# Patient Record
Sex: Male | Born: 1990 | Race: White | Hispanic: No | Marital: Single | Smoking: Never smoker
Health system: Southern US, Community
[De-identification: ages and names within clinical notes are randomized; demographics above are authoritative.]

## PROBLEM LIST (undated history)

## (undated) DIAGNOSIS — B019 Varicella without complication: Secondary | ICD-10-CM

## (undated) DIAGNOSIS — J302 Other seasonal allergic rhinitis: Secondary | ICD-10-CM

## (undated) DIAGNOSIS — J45909 Unspecified asthma, uncomplicated: Secondary | ICD-10-CM

## (undated) DIAGNOSIS — K219 Gastro-esophageal reflux disease without esophagitis: Secondary | ICD-10-CM

## (undated) DIAGNOSIS — K2970 Gastritis, unspecified, without bleeding: Secondary | ICD-10-CM

## (undated) HISTORY — DX: Other seasonal allergic rhinitis: J30.2

## (undated) HISTORY — DX: Gastro-esophageal reflux disease without esophagitis: K21.9

## (undated) HISTORY — DX: Unspecified asthma, uncomplicated: J45.909

## (undated) HISTORY — DX: Varicella without complication: B01.9

---
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
## 2015-09-15 ENCOUNTER — Telehealth: Payer: Self-pay | Admitting: Behavioral Health

## 2015-09-15 NOTE — Telephone Encounter (Signed)
Unable to reach patient at time of Pre-Visit Call.  Left message for patient to return call when available.    

## 2015-09-16 ENCOUNTER — Ambulatory Visit (INDEPENDENT_AMBULATORY_CARE_PROVIDER_SITE_OTHER): Payer: BLUE CROSS/BLUE SHIELD | Admitting: Family Medicine

## 2015-09-16 ENCOUNTER — Encounter: Payer: Self-pay | Admitting: Family Medicine

## 2015-09-16 VITALS — BP 116/59 | HR 63 | Temp 98.1°F | Ht 71.25 in | Wt 200.0 lb

## 2015-09-16 DIAGNOSIS — A749 Chlamydial infection, unspecified: Secondary | ICD-10-CM

## 2015-09-16 DIAGNOSIS — Z113 Encounter for screening for infections with a predominantly sexual mode of transmission: Secondary | ICD-10-CM

## 2015-09-16 DIAGNOSIS — R110 Nausea: Secondary | ICD-10-CM | POA: Diagnosis not present

## 2015-09-16 DIAGNOSIS — M25539 Pain in unspecified wrist: Secondary | ICD-10-CM | POA: Diagnosis not present

## 2015-09-16 MED ORDER — ONDANSETRON HCL 4 MG PO TABS: 4.0000 mg | ORAL_TABLET | Freq: Three times a day (TID) | ORAL | Status: DC | PRN

## 2015-09-16 NOTE — Progress Notes (Addendum)
[facility] at [facility] [address] [place], [place] [address] [phone]  Date:  09/16/2015   Name:  Dillon Martin   DOB:  03-Oct-1990   MRN:  [id number]  PCP:  Abbe Amsterdam, MD    Chief Complaint: Establish Care   History of Present Illness:  Dillon Martin is a 25 y.o. very pleasant male patient who presents with the following:  Here today as a new patient with several concerns. He would like STI testing.  A recent ex-GF had some sort of infection but he is not sure what exactly.   He has not noted any symptoms of same but wants to be sure all is ok  He has noted wrist pain for about 6 months.  It is more in his right hand, but some in the left.  He does use his hands a lot in his work and in his hobby as a Technical sales engineer.    He may have some tinging in his pinky fingers.   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  Not sure if he might have some OA in his hands as well His sx are worse at the end of the day He does tend to be on his computer a fair amount playing games  He has noted some pain in his right flank and occasional nausea He will very occasionally throw up.  He has not noted any foods that upset his stomach in particular Appetite is ok  He is generally in good health. Admits that his diet is not always the best but he tries to do well Never a smoker   There are no active problems to display for this patient.   No past medical history on file.  No past surgical history on file.  Social History  Substance Use Topics  . Smoking status: Never Smoker   . Smokeless tobacco: Not on file  . Alcohol Use: Not on file    No family history on file.  Allergies not on file  Medication list has been reviewed and updated.  No current outpatient prescriptions on file prior to visit.   No current facility-administered medications on file prior to visit.    Review of Systems:  As  per HPI- otherwise negative.   Physical Examination: Filed Vitals:   09/16/15 1429  BP: 116/59  Pulse: 63  Temp: 98.1 F (36.7 C)   Filed Vitals:   09/16/15 1429  Height: 5' 11.25" (1.81 m)  Weight: 200 lb (90.719 kg)   Body mass index is 27.69 kg/(m^2). Ideal Body Weight: Weight in (lb) to have BMI = 25: 180.1  GEN: WDWN, NAD, Non-toxic, A & O x 3, looks well HEENT: Atraumatic, Normocephalic. Neck supple. No masses, No LAD.  Bilateral TM wnl, oropharynx normal.  PEERL,EOMI.   Ears and Nose: No external deformity. CV: RRR, No M/G/R. No JVD. No thrill. No extra heart sounds. PULM: CTA B, no wheezes, crackles, rhonchi. No retractions. No resp. distress. No accessory muscle use. ABD: S, NT, ND, +BS. No rebound. No HSM.  Benign belly EXTR: No c/c/e NEURO Normal gait.  PSYCH: Normally interactive. Conversant. Not depressed or anxious appearing.  Calm demeanor.  Bilateral hands, wrists and elbows WNL.  No evidence of CTS, no joint stiffness or nodules, no heat, swelling or redness   Assessment and Plan: Screening for STD (sexually transmitted disease) - Plan: Hepatitis B surface antibody, Hepatitis  B surface antigen, Hepatitis C antibody, HIV antibody, GC/Chlamydia Probe Amp, RPR, Trichomonas vaginalis, RNA  Pain in wrist, unspecified laterality  Nausea without vomiting - Plan: Comprehensive metabolic panel, CBC, ondansetron (ZOFRAN) 4 MG tablet  [facility] STI screening as above Suspect overuse of his wrist; suggested an OTC cock-up splint  We will do STI screening for you today and I will be in touch with your results asap You can use the zofran as needed for any nausea or vomiting.  I will do basic labs today to look for any cause of stomach upset.  However, you also might try taking a 2 week course of OTC prilosec or pepcid to see if this is helpful  Try a wrist splint for your right wrist while you are at work.  Also, try to rest your hands when you are  not at work- less computer time  Let me know if your nausea/ stomach symptoms or wrist symptoms do not improve  Signed Abbe AmsterdamJessica Copland, MD  Called pt to let him know about positive chlamydia.  Will treat with azithromycin. Advised that any partner(s) must also be treated and no sex for 10 days after treatment.  He agrees with plan  Meds ordered this encounter  Medications  . ondansetron (ZOFRAN) 4 MG tablet    Sig: Take 1 tablet (4 mg total) by mouth every 8 (eight) hours as needed for nausea or vomiting.    Dispense:  20 tablet    Refill:  0  . azithromycin (ZITHROMAX) 250 MG tablet    Sig: Take 4 pills (1 gm) once    Dispense:  4 tablet    Refill:  0

## 2015-09-16 NOTE — Progress Notes (Signed)
Pre visit review using our clinic review tool, if applicable. No additional management support is needed unless otherwise documented below in the visit note. 

## 2015-09-16 NOTE — Patient Instructions (Signed)
We will do STI screening for you today and I will be in touch with your results asap You can use the zofran as needed for any nausea or vomiting.  I will do basic labs today to look for any cause of stomach upset.  However, you also might try taking a 2 week course of OTC prilosec or pepcid to see if this is helpful  Try a wrist splint for your right wrist while you are at work.  Also, try to rest your hands when you are not at work- less computer time  Let me know if your nausea/ stomach symptoms or wrist symptoms do not improve

## 2015-09-17 LAB — CBC
HCT: 42.4 % (ref 39.0–52.0)
Hemoglobin: 14.5 g/dL (ref 13.0–17.0)
MCHC: 34.3 g/dL (ref 30.0–36.0)
MCV: 90.1 fl (ref 78.0–100.0)
PLATELETS: 151 10*3/uL (ref 150.0–400.0)
RBC: 4.7 Mil/uL (ref 4.22–5.81)
RDW: 13.4 % (ref 11.5–15.5)
WBC: 5.4 10*3/uL (ref 4.0–10.5)

## 2015-09-17 LAB — COMPREHENSIVE METABOLIC PANEL
ALT: 13 U/L (ref 0–53)
AST: 19 U/L (ref 0–37)
Albumin: 4.3 g/dL (ref 3.5–5.2)
Alkaline Phosphatase: 30 U/L — ABNORMAL LOW (ref 39–117)
BILIRUBIN TOTAL: 0.6 mg/dL (ref 0.2–1.2)
BUN: 11 mg/dL (ref 6–23)
CALCIUM: 9 mg/dL (ref 8.4–10.5)
CO2: 32 meq/L (ref 19–32)
Chloride: 104 mEq/L (ref 96–112)
Creatinine, Ser: 0.92 mg/dL (ref 0.40–1.50)
GFR: 108.44 mL/min (ref >60.00)
Glucose, Bld: 87 mg/dL (ref 70–99)
Potassium: 3.8 mEq/L (ref 3.5–5.1)
Sodium: 140 mEq/L (ref 135–145)
Total Protein: 6.6 g/dL (ref 6.0–8.3)

## 2015-09-17 LAB — HEPATITIS C ANTIBODY: HCV AB: NEGATIVE

## 2015-09-17 LAB — GC/CHLAMYDIA PROBE AMP
CT PROBE, AMP APTIMA: DETECTED — AB
GC PROBE AMP APTIMA: NOT DETECTED

## 2015-09-17 LAB — TRICHOMONAS VAGINALIS, PROBE AMP: T vaginalis RNA: NEGATIVE

## 2015-09-17 LAB — HEPATITIS B SURFACE ANTIBODY, QUANTITATIVE: Hepatitis B-Post: 0 m[IU]/mL

## 2015-09-17 LAB — HIV ANTIBODY (ROUTINE TESTING W REFLEX): HIV: NONREACTIVE

## 2015-09-17 LAB — HEPATITIS B SURFACE ANTIGEN: HEP B S AG: NEGATIVE

## 2015-09-17 LAB — RPR

## 2015-09-20 ENCOUNTER — Encounter: Payer: Self-pay | Admitting: Family Medicine

## 2015-09-20 MED ORDER — AZITHROMYCIN 250 MG PO TABS: ORAL_TABLET | ORAL | Status: DC

## 2015-09-20 NOTE — Addendum Note (Signed)
Addended by: Abbe AmsterdamOPLAND, Najma Bozarth C on: 09/20/2015 05:04 PM   Modules accepted: Orders

## 2015-09-22 ENCOUNTER — Other Ambulatory Visit: Payer: Self-pay | Admitting: Emergency Medicine

## 2015-09-22 NOTE — Telephone Encounter (Signed)
[facility] to [facility] to report recent lab results.

## 2016-03-31 ENCOUNTER — Encounter: Payer: Self-pay | Admitting: Medical

## 2016-03-31 ENCOUNTER — Ambulatory Visit (INDEPENDENT_AMBULATORY_CARE_PROVIDER_SITE_OTHER): Payer: BLUE CROSS/BLUE SHIELD | Admitting: Medical

## 2016-03-31 VITALS — BP 110/78 | HR 77 | Temp 97.9°F | Resp 16 | Ht 71.25 in | Wt 192.0 lb

## 2016-03-31 DIAGNOSIS — R05 Cough: Secondary | ICD-10-CM | POA: Diagnosis not present

## 2016-03-31 DIAGNOSIS — R059 Cough, unspecified: Secondary | ICD-10-CM

## 2016-03-31 DIAGNOSIS — J0100 Acute maxillary sinusitis, unspecified: Secondary | ICD-10-CM

## 2016-03-31 DIAGNOSIS — J209 Acute bronchitis, unspecified: Secondary | ICD-10-CM | POA: Diagnosis not present

## 2016-03-31 MED ORDER — FLUTICASONE PROPIONATE 50 MCG/ACT NA SUSP: 2.0000 | Freq: Every day | NASAL | 1 refills | Status: DC

## 2016-03-31 MED ORDER — BENZONATATE 100 MG PO CAPS: 100.0000 mg | ORAL_CAPSULE | Freq: Three times a day (TID) | ORAL | 0 refills | Status: DC | PRN

## 2016-03-31 MED ORDER — DOXYCYCLINE HYCLATE 100 MG PO TABS: 100.0000 mg | ORAL_TABLET | Freq: Two times a day (BID) | ORAL | 0 refills | Status: DC

## 2016-03-31 NOTE — Progress Notes (Signed)
Subjective:    Patient ID: Dillon Martin, male    DOB: 11/18/1990, 25 y.o.   MRN: [id number]  HPI  Pt in with chest congestion, sinus pressure, ear pressure, pnd, and cough. States non productive mostly then states mild occasional productive. Some fatigue, chills and sweats. Some light headed at times.(overall symptoms for 7 days)  Symptoms for one week.(but states worst past 3 days) States severe symtoms kicked in then.  Pt takes zyrtec prn.  Pt has history of allergies.  Pt gets occasionally sinus infections.   Review of Systems  Constitutional: Positive for chills, diaphoresis and fatigue.  HENT: Positive for congestion, ear pain, postnasal drip, sinus pain and sinus pressure.   Respiratory: Positive for cough. Negative for shortness of breath and wheezing.   Cardiovascular: Negative for chest pain and palpitations.  Gastrointestinal: Negative for abdominal pain, diarrhea and nausea.       Faint nausea.  Musculoskeletal: Negative for back pain and neck pain.  Skin: Negative for rash.  Neurological: Positive for dizziness.       Light headed. When he bends over and stands feels light headed.  Hematological: Negative for adenopathy. Does not bruise/bleed easily.  Psychiatric/Behavioral: Negative for behavioral problems and confusion.    Past Medical History:  Diagnosis Date  . Chicken pox   . Childhood asthma   . GERD (gastroesophageal reflux disease)   . Seasonal allergies      Social History   Social History  . Marital status: Single    Spouse name: N/A  . Number of children: N/A  . Years of education: N/A   Occupational History  . Chef    Social History Main Topics  . Smoking status: Never Smoker  . Smokeless tobacco: Never Used  . Alcohol use No  . Drug use: No  . Sexual activity: Not on file   Other Topics Concern  . Not on file   Social History Narrative  . No narrative on file    Past Surgical History:  Procedure Laterality Date  .  ADENOIDECTOMY  2000  . ANKLE SURGERY  2006   Pins in ankles to correct flat feet  . INGUINAL HERNIA REPAIR  1998  . NASAL SEPTUM SURGERY  2013  . WISDOM TOOTH EXTRACTION  2012    Family History  Problem Relation Age of Onset  . Hypertension Mother   . Heart disease Maternal Grandfather   . Hypertension Maternal Grandfather   . Diabetes Maternal Grandfather     Allergies  Allergen Reactions  . Amoxicillin     Current Outpatient Prescriptions on File Prior to Visit  Medication Sig Dispense Refill  . ondansetron (ZOFRAN) 4 MG tablet Take 1 tablet (4 mg total) by mouth every 8 (eight) hours as needed for nausea or vomiting. 20 tablet 0   No current facility-administered medications on file prior to visit.     BP 110/78 (BP Location: Left Arm, Patient Position: Sitting, Cuff Size: Large)   Pulse 77   Temp 97.9 F (36.6 C) (Oral)   Resp 16   Ht 5' 11.25" (1.81 m)   Wt 192 lb (87.1 kg)   SpO2 99%   BMI 26.59 kg/m       Objective:   Physical Exam  General  Mental Status - Alert. General Appearance - Well groomed. Not in acute distress.  Skin Rashes- No Rashes.  HEENT Head- Normal. Ear Auditory Canal - Left- Normal. Right - Normal.Tympanic Membrane- Left- Normal. Right- Normal. Eye Sclera/Conjunctiva- Left-  Normal. Right- Normal. Nose & Sinuses Nasal Mucosa- Left-  Boggy and Congested. Right-  Boggy and  Congested.Bilateral maxillary sinus pressure but no frontal sinus pressure.(pt does have deviated septum)  Mouth & Throat Lips: Upper Lip- Normal: no dryness, cracking, pallor, cyanosis, or vesicular eruption. Lower Lip-Normal: no dryness, cracking, pallor, cyanosis or vesicular eruption. Buccal Mucosa- Bilateral- No Aphthous ulcers. Oropharynx- No Discharge or Erythema. Tonsils: Characteristics- Bilateral- No Erythema or Congestion. Size/Enlargement- Bilateral- No enlargement. Discharge- bilateral-None. +pnd  Neck Neck- Supple. No Masses.   Chest and Lung  Exam Auscultation: Breath Sounds:-Clear even and unlabored.  Cardiovascular Auscultation:Rythm- Regular, rate and rhythm. Murmurs & Other Heart Sounds:Ausculatation of the heart reveal- No Murmurs.  Lymphatic Head & Neck General Head & Neck Lymphatics: Bilateral: Description- No Localized lymphadenopathy.       Assessment & Plan:  You appear to have bronchitis and sinusitis(more sinus infection). Rest hydrate and tylenol for fever. I am prescribing cough medicine benzonatate , and doxycycline antibiotic. Flonase for nasal congestion.  You should gradually get better. If not then notify us and would recommend a chest xray.  Follow up in 7-10 days or as needed  Discussed with pt  about getting cxr today in light of his report of fever, chills and sweats but he declined.  Daniel Ritthaler, Ramon DredgeEdward, PA-C

## 2016-03-31 NOTE — Patient Instructions (Addendum)
You appear to have bronchitis and sinusitis(more sinus infection). Rest hydrate and tylenol for fever. I am prescribing cough medicine benzonatate , and doxycycline antibiotic. Flonase for nasal congestion.  You should gradually get better. If not then notify us and would recommend a chest xray.  Follow up in 7-10 days or as needed

## 2016-03-31 NOTE — Progress Notes (Signed)
Pre visit review using our clinic review tool, if applicable. No additional management support is needed unless otherwise documented below in the visit note/SLS  

## 2016-07-20 ENCOUNTER — Encounter: Payer: Self-pay | Admitting: Family Medicine

## 2016-07-20 ENCOUNTER — Ambulatory Visit (INDEPENDENT_AMBULATORY_CARE_PROVIDER_SITE_OTHER): Payer: BLUE CROSS/BLUE SHIELD | Admitting: Family Medicine

## 2016-07-20 VITALS — BP 133/88 | HR 75 | Temp 98.3°F | Ht 71.25 in | Wt 196.8 lb

## 2016-07-20 DIAGNOSIS — R1030 Lower abdominal pain, unspecified: Secondary | ICD-10-CM

## 2016-07-20 DIAGNOSIS — R109 Unspecified abdominal pain: Secondary | ICD-10-CM

## 2016-07-20 NOTE — Patient Instructions (Signed)
You may have a hernia vs an abdominal strain.  Let's have you rest- no heavy lifting- and use ibuprofen/ aleve as needed over the next few days. We will get a blood count for you today- if your white cells are high we become more suspicious of an infection (appendicitis) and will need to consider a CT scan  Otherwise if you develop worsening pain, fever, or vomiting please go to the emergency room.  Also if you develop acute testicular pain please go to the ER.    If you continue to have discomfort next week we will plan to have you see general surgery for an evaluation for possible hernia

## 2016-07-20 NOTE — Progress Notes (Addendum)
[facility] at [facility] [address] [place], [place] [address] [phone]  Date:  07/20/2016   Name:  Dillon Martin   DOB:  28-Oct-1990   MRN:  [id number]  PCP:  Abbe Amsterdam, MD    Chief Complaint: Hernia (c/o hernia/lower abdominal pain near the groin area. Sx's started last night. )   History of Present Illness:  Dillon Martin is a 26 y.o. very pleasant male patient who presents with the following: Concern about a possible hernia- he has noted symptoms since yesterday evening.   H works at [employer] and lifts a lot of heavy things at work.  He worked a normal shift and was quite physically active yesterday but was not aware of any acute injury.  However when he drove home and  "twisted to get out of the car" he noted the onset of pain in his anterior groin/ over his pubic bone.    The pain was still there this am so he decided to come in for evaluation No dysuria, no penile discharge, no hematuria He does not appreciate any unusual bulge in his groin No belly pain No nausea, vomiting, no anorexia  No testicular pain- he does feel like the perineum posterior to his scrotum is tender however  As a young child he had what sounds like bilateral inguinal hernias which were repaired at approx 26 years old.  He is not sure of his exact history in this regard.  He is otherwise generally in excellent health  There are no active problems to display for this patient.   Past Medical History:  Diagnosis Date  . Chicken pox   . Childhood asthma   . GERD (gastroesophageal reflux disease)   . Seasonal allergies     Past Surgical History:  Procedure Laterality Date  . ADENOIDECTOMY  2000  . ANKLE SURGERY  2006   Pins in ankles to correct flat feet  . INGUINAL HERNIA REPAIR  1998  . NASAL SEPTUM SURGERY  2013  . WISDOM TOOTH EXTRACTION  2012    Social History  Substance Use Topics  . Smoking status: Never Smoker  .  Smokeless tobacco: Never Used  . Alcohol use No    Family History  Problem Relation Age of Onset  . Hypertension Mother   . Heart disease Maternal Grandfather   . Hypertension Maternal Grandfather   . Diabetes Maternal Grandfather     Allergies  Allergen Reactions  . Amoxicillin     Medication list has been reviewed and updated.  Current Outpatient Prescriptions on File Prior to Visit  Medication Sig Dispense Refill  . cetirizine (ZYRTEC) 10 MG chewable tablet Chew 10 mg by mouth daily as needed for allergies.    . Multiple Vitamins-Minerals (HM MULTIVITAMIN ADULT GUMMY) CHEW Chew by mouth daily.     No current facility-administered medications on file prior to visit.     Review of Systems:  As per HPI- otherwise negative.   Physical Examination: Vitals:   07/20/16 1617  BP: 133/88  Pulse: 75  Temp: 98.3 F (36.8 C)   Vitals:   07/20/16 1617  Weight: 196 lb 12.8 oz (89.3 kg)  Height: 5' 11.25" (1.81 m)   Body mass index is 27.26 kg/m. Ideal Body Weight: Weight in (lb) to have BMI = 25: 180.1  GEN: WDWN, NAD, Non-toxic, A & O x 3, looks well HEENT: Atraumatic, Normocephalic. Neck supple. No masses, No LAD. Ears and Nose:  No external deformity. CV: RRR, No M/G/R. No JVD. No thrill. No extra heart sounds. PULM: CTA B, no wheezes, crackles, rhonchi. No retractions. No resp. distress. No accessory muscle use. ABD: S,ND, +BS. No rebound. No HSM.  He does indicate minimal tenderness of the lowermost portion of his abdomen.  However on serial exams his belly seems benign- his tenderness is really over the bony pelvis and lower.   He has tenderness over the pubic symphysis and over the pubic bone No definite inguinal hernia on standing exam with valsalva, but he does have a questionable small reducible bulge on the left.  Testes are normal and non- tender, no sign of torsion or infection Small faint scars on abdomen appear c/w history of inguinal hernia repair as a  young child EXTR: No c/c/e NEURO Normal gait.  PSYCH: Normally interactive. Conversant. Not depressed or anxious appearing.  Calm demeanor.    Assessment and Plan: Abdominal discomfort - Plan: CBC, Basic metabolic panel  Here today with groin pain for a little under 24 hours.  He appears comfortable and is not in any distress.  Suspect that he may be developing a hernia vs another muscle strain in the abd wall.  For the time being will obtain basic labs and have him avoid lifting for several days As his exam is somewhat non- focal he is given strict return precautions and warned that another cause for his pain may become apparent- he will seek care right away if any worsening of his pain.  See pt instuctions  Signed Abbe Amsterdam, MD  Received labs - called and [facility] that they are normal.  Please keep me posted about how he is doing

## 2016-07-20 NOTE — Progress Notes (Signed)
Pre visit review using our clinic review tool, if applicable. No additional management support is needed unless otherwise documented below in the visit note. 

## 2016-07-21 LAB — CBC
HEMATOCRIT: 43.4 % (ref 39.0–52.0)
Hemoglobin: 14.6 g/dL (ref 13.0–17.0)
MCHC: 33.6 g/dL (ref 30.0–36.0)
MCV: 91.5 fl (ref 78.0–100.0)
PLATELETS: 148 10*3/uL — AB (ref 150.0–400.0)
RBC: 4.74 Mil/uL (ref 4.22–5.81)
RDW: 13.1 % (ref 11.5–15.5)
WBC: 5.2 10*3/uL (ref 4.0–10.5)

## 2016-07-21 LAB — BASIC METABOLIC PANEL
BUN: 13 mg/dL (ref 6–23)
CALCIUM: 9 mg/dL (ref 8.4–10.5)
CO2: 30 meq/L (ref 19–32)
Chloride: 105 mEq/L (ref 96–112)
Creatinine, Ser: 0.87 mg/dL (ref 0.40–1.50)
GFR: 114.81 mL/min (ref >60.00)
GLUCOSE: 84 mg/dL (ref 70–99)
Potassium: 3.7 mEq/L (ref 3.5–5.1)
SODIUM: 140 meq/L (ref 135–145)

## 2016-07-30 NOTE — Progress Notes (Deleted)
[facility] at [facility] [address], [address] [place], [place] [address] [phone]  Date:  07/31/2016   Name:  Dillon Martin   DOB:  [birth year]   MRN:  [id number]  PCP:  Abbe Amsterdam, MD    Chief Complaint: No chief complaint on file.   History of Present Illness:  Dillon Martin is a 26 y.o. very pleasant male patient who presents with the following:  Sen by myself 10 days ago with concern of possible inguinal hernia vs other injury  There are no active problems to display for this patient.   Past Medical History:  Diagnosis Date  . Chicken pox   . Childhood asthma   . GERD (gastroesophageal reflux disease)   . Seasonal allergies     Past Surgical History:  Procedure Laterality Date  . ADENOIDECTOMY  2000  . ANKLE SURGERY  2006   Pins in ankles to correct flat feet  . INGUINAL HERNIA REPAIR  1998  . NASAL SEPTUM SURGERY  2013  . WISDOM TOOTH EXTRACTION  2012    Social History  Substance Use Topics  . Smoking status: Never Smoker  . Smokeless tobacco: Never Used  . Alcohol use No    Family History  Problem Relation Age of Onset  . Hypertension Mother   . Heart disease Maternal Grandfather   . Hypertension Maternal Grandfather   . Diabetes Maternal Grandfather     Allergies  Allergen Reactions  . Amoxicillin     Medication list has been reviewed and updated.  Current Outpatient Prescriptions on File Prior to Visit  Medication Sig Dispense Refill  . cetirizine (ZYRTEC) 10 MG chewable tablet Chew 10 mg by mouth daily as needed for allergies.    . Multiple Vitamins-Minerals (HM MULTIVITAMIN ADULT GUMMY) CHEW Chew by mouth daily.     No current facility-administered medications on file prior to visit.     Review of Systems:  As per HPI- otherwise negative.   Physical Examination: There were no vitals filed for this visit. There were no vitals filed for this visit. There is no height or  weight on file to calculate BMI. Ideal Body Weight:    GEN: WDWN, NAD, Non-toxic, A & O x 3 HEENT: Atraumatic, Normocephalic. Neck supple. No masses, No LAD. Ears and Nose: No external deformity. CV: RRR, No M/G/R. No JVD. No thrill. No extra heart sounds. PULM: CTA B, no wheezes, crackles, rhonchi. No retractions. No resp. distress. No accessory muscle use. ABD: S, NT, ND, +BS. No rebound. No HSM. EXTR: No c/c/e NEURO Normal gait.  PSYCH: Normally interactive. Conversant. Not depressed or anxious appearing.  Calm demeanor.    Assessment and Plan: ***  Signed Abbe Amsterdam, MD

## 2016-07-31 ENCOUNTER — Ambulatory Visit: Payer: BLUE CROSS/BLUE SHIELD | Admitting: Family Medicine

## 2016-10-26 ENCOUNTER — Telehealth: Payer: Self-pay | Admitting: Family Medicine

## 2016-10-26 ENCOUNTER — Ambulatory Visit: Payer: BLUE CROSS/BLUE SHIELD | Admitting: Family Medicine

## 2016-10-26 NOTE — Telephone Encounter (Signed)
Pt lvm at 2:31 to cancel his apt.   Should pt be charged

## 2016-10-27 NOTE — Telephone Encounter (Signed)
No charge. 

## 2016-10-30 ENCOUNTER — Emergency Department (HOSPITAL_BASED_OUTPATIENT_CLINIC_OR_DEPARTMENT_OTHER)
Admission: EM | Admit: 2016-10-30 | Discharge: 2016-10-30 | Disposition: A | Discharge status: Home / Self Care | Payer: Self-pay | Attending: Emergency Medicine | Admitting: Emergency Medicine

## 2016-10-30 ENCOUNTER — Emergency Department (HOSPITAL_BASED_OUTPATIENT_CLINIC_OR_DEPARTMENT_OTHER): Payer: Self-pay

## 2016-10-30 ENCOUNTER — Encounter (HOSPITAL_BASED_OUTPATIENT_CLINIC_OR_DEPARTMENT_OTHER): Payer: Self-pay

## 2016-10-30 DIAGNOSIS — J45909 Unspecified asthma, uncomplicated: Secondary | ICD-10-CM | POA: Insufficient documentation

## 2016-10-30 DIAGNOSIS — R112 Nausea with vomiting, unspecified: Secondary | ICD-10-CM | POA: Insufficient documentation

## 2016-10-30 LAB — LIPASE, BLOOD: Lipase: 29 U/L (ref 11–51)

## 2016-10-30 LAB — URINALYSIS, ROUTINE W REFLEX MICROSCOPIC
BILIRUBIN URINE: NEGATIVE
GLUCOSE, UA: NEGATIVE mg/dL
HGB URINE DIPSTICK: NEGATIVE
KETONES UR: 40 mg/dL — AB
Leukocytes, UA: NEGATIVE
Nitrite: NEGATIVE
PH: 8.5 — AB (ref 5.0–8.0)
Protein, ur: NEGATIVE mg/dL
Specific Gravity, Urine: 1.012 (ref 1.005–1.030)

## 2016-10-30 LAB — COMPREHENSIVE METABOLIC PANEL
ALBUMIN: 4.9 g/dL (ref 3.5–5.0)
ALK PHOS: 45 U/L (ref 38–126)
ALT: 56 U/L (ref 17–63)
ANION GAP: 14 (ref 5–15)
AST: 75 U/L — AB (ref 15–41)
BILIRUBIN TOTAL: 1.3 mg/dL — AB (ref 0.3–1.2)
BUN: 10 mg/dL (ref 6–20)
CALCIUM: 9.6 mg/dL (ref 8.9–10.3)
CO2: 21 mmol/L — ABNORMAL LOW (ref 22–32)
Chloride: 101 mmol/L (ref 101–111)
Creatinine, Ser: 0.9 mg/dL (ref 0.61–1.24)
GFR calc Af Amer: >60 mL/min (ref >60)
GFR calc non Af Amer: >60 mL/min (ref >60)
GLUCOSE: 144 mg/dL — AB (ref 65–99)
Potassium: 3.5 mmol/L (ref 3.5–5.1)
Sodium: 136 mmol/L (ref 135–145)
TOTAL PROTEIN: 8 g/dL (ref 6.5–8.1)

## 2016-10-30 LAB — CBC WITH DIFFERENTIAL/PLATELET
BASOS PCT: 0 %
Basophils Absolute: 0 10*3/uL (ref 0.0–0.1)
Eosinophils Absolute: 0 10*3/uL (ref 0.0–0.7)
Eosinophils Relative: 0 %
HEMATOCRIT: 45.9 % (ref 39.0–52.0)
HEMOGLOBIN: 16.7 g/dL (ref 13.0–17.0)
LYMPHS PCT: 6 %
Lymphs Abs: 0.8 10*3/uL (ref 0.7–4.0)
MCH: 31.3 pg (ref 26.0–34.0)
MCHC: 36.4 g/dL — AB (ref 30.0–36.0)
MCV: 86 fL (ref 78.0–100.0)
MONOS PCT: 4 %
Monocytes Absolute: 0.5 10*3/uL (ref 0.1–1.0)
NEUTROS ABS: 12.1 10*3/uL — AB (ref 1.7–7.7)
NEUTROS PCT: 90 %
Platelets: 185 10*3/uL (ref 150–400)
RBC: 5.34 MIL/uL (ref 4.22–5.81)
RDW: 12.4 % (ref 11.5–15.5)
WBC: 13.4 10*3/uL — ABNORMAL HIGH (ref 4.0–10.5)

## 2016-10-30 MED ORDER — LIDOCAINE VISCOUS 2 % MT SOLN: 15.0000 mL | OROMUCOSAL | 0 refills | Status: AC | PRN

## 2016-10-30 MED ORDER — SODIUM CHLORIDE 0.9 % IV BOLUS (SEPSIS)
1000.0000 mL | Freq: Once | INTRAVENOUS | Status: AC
  Administered 2016-10-30: 1000 mL via INTRAVENOUS

## 2016-10-30 MED ORDER — ONDANSETRON 4 MG PO TBDP: 4.0000 mg | ORAL_TABLET | Freq: Three times a day (TID) | ORAL | 0 refills | Status: DC | PRN

## 2016-10-30 MED ORDER — ONDANSETRON HCL 4 MG/2ML IJ SOLN
4.0000 mg | Freq: Once | INTRAMUSCULAR | Status: AC
  Administered 2016-10-30: 4 mg via INTRAVENOUS
  Filled 2016-10-30: qty 2

## 2016-10-30 MED ORDER — GI COCKTAIL ~~LOC~~
30.0000 mL | Freq: Once | ORAL | Status: AC
  Administered 2016-10-30: 30 mL via ORAL
  Filled 2016-10-30: qty 30

## 2016-10-30 MED ORDER — METOCLOPRAMIDE HCL 5 MG/ML IJ SOLN
10.0000 mg | Freq: Once | INTRAMUSCULAR | Status: AC
  Administered 2016-10-30: 10 mg via INTRAVENOUS
  Filled 2016-10-30: qty 2

## 2016-10-30 NOTE — ED Notes (Signed)
Patient transported to Ultrasound 

## 2016-10-30 NOTE — ED Notes (Signed)
Tolerated po fluids well.   

## 2016-10-30 NOTE — ED Notes (Signed)
ED Provider at bedside. 

## 2016-10-30 NOTE — ED Provider Notes (Signed)
[facility] Provider Note   CSN: [id number] Arrival date & time: 10/30/16  4098  By signing my name below, I, Vista Mink, attest that this documentation has been prepared under the direction and in the presence of Mia McDonald PA-C  Electronically Signed: Vista Mink, ED Scribe. 10/30/16. 7:49 PM.  History   Chief Complaint Chief Complaint  Patient presents with  . Abdominal Pain    HPI HPI Comments: Dillon Martin is a 26 y.o. male who presents to the Emergency Department complaining of gradual onset, constant, 7/10 abdominal pain with associated nausea and vomiting that started this morning. Pt started feeling ill this morning upon waking. Pt also had similar symptoms last week, 6 days ago but reports that they resolved without treatment 4 days afterwards. Pt has had normal BM's, no hematochezia. He also reports multiple episodes of vomiting today, no hematemesis. Pt describes his vomit as bright yellow. His abdominal pain is exacerbated when taking a deep breath, no alleviating factors noted. Pt had inguinal hernia repair as a child, no other abdominal surgeries. No other chronic health problems. No illicit drug use. No other known sick contacts. No recent long distance travel, camping. No fever, chills, diarrhea. No dysuria, penile discharge.  The history is provided by the patient. No language interpreter was used.    Past Medical History:  Diagnosis Date  . Chicken pox   . Childhood asthma   . GERD (gastroesophageal reflux disease)   . Seasonal allergies    There are no active problems to display for this patient.  Past Surgical History:  Procedure Laterality Date  . ADENOIDECTOMY  2000  . ANKLE SURGERY  2006   Pins in ankles to correct flat feet  . INGUINAL HERNIA REPAIR  1998  . NASAL SEPTUM SURGERY  2013  . WISDOM TOOTH EXTRACTION  2012     Home Medications    Prior to Admission medications   Medication Sig Start Date End Date Taking?  Authorizing Provider  lidocaine (XYLOCAINE) 2 % solution Use as directed 15 mLs in the mouth or throat every 4 (four) hours as needed for mouth pain. 10/30/16   McDonald, Mia A, PA-C  ondansetron (ZOFRAN ODT) 4 MG disintegrating tablet Take 1 tablet (4 mg total) by mouth every 8 (eight) hours as needed for nausea or vomiting. 10/30/16   McDonald, Mia A, PA-C    Family History Family History  Problem Relation Age of Onset  . Hypertension Mother   . Heart disease Maternal Grandfather   . Hypertension Maternal Grandfather   . Diabetes Maternal Grandfather     Social History Social History  Substance Use Topics  . Smoking status: Never Smoker  . Smokeless tobacco: Never Used  . Alcohol use No     Allergies   Amoxicillin   Review of Systems Review of Systems  Constitutional: Negative for chills and fever.  Gastrointestinal: Positive for abdominal pain, nausea and vomiting. Negative for blood in stool and diarrhea.  Genitourinary: Negative for discharge and dysuria.     Physical Exam Updated Vital Signs BP (!) 154/94 (BP Location: Left Arm)   Pulse 68   Temp 98.5 F (36.9 C) (Oral)   Resp 18   Ht 5\' 11"  (1.803 m)   Wt 86.6 kg (191 lb)   SpO2 100%   BMI 26.64 kg/m   Physical Exam  Constitutional: He appears well-developed.  HENT:  Head: Normocephalic.  Mouth/Throat: Oropharynx is clear and moist.  Mucous membranes moist  Eyes: Conjunctivae  are normal.  Neck: Neck supple.  Cardiovascular: Normal rate, regular rhythm, normal heart sounds and intact distal pulses.  Exam reveals no gallop and no friction rub.   No murmur heard. Pulmonary/Chest: Effort normal and breath sounds normal. No respiratory distress. He has no wheezes. He has no rales.  Abdominal: Soft. Bowel sounds are normal. He exhibits no distension. There is tenderness. There is no rebound and no guarding.  Mild tenderness to suprapubic and LLQ. Negative Murphy's sign.   Neurological: He is alert.  Skin:  Skin is warm and dry. Capillary refill takes less than 2 seconds.  Psychiatric: His behavior is normal.  Nursing note and vitals reviewed.  ED Treatments / Results  DIAGNOSTIC STUDIES: Oxygen Saturation is 100% on RA, normal by my interpretation.  COORDINATION OF CARE: 7:41 PM-Discussed treatment plan with pt at bedside and pt agreed to plan.   Labs (all labs ordered are listed, but only abnormal results are displayed) Labs Reviewed  CBC WITH DIFFERENTIAL/PLATELET - Abnormal; Notable for the following:       Result Value   WBC 13.4 (*)    MCHC 36.4 (*)    Neutro Abs 12.1 (*)    All other components within normal limits  COMPREHENSIVE METABOLIC PANEL - Abnormal; Notable for the following:    CO2 21 (*)    Glucose, Bld 144 (*)    AST 75 (*)    Total Bilirubin 1.3 (*)    All other components within normal limits  URINALYSIS, ROUTINE W REFLEX MICROSCOPIC - Abnormal; Notable for the following:    pH 8.5 (*)    Ketones, ur 40 (*)    All other components within normal limits  LIPASE, BLOOD    EKG  EKG Interpretation None       Radiology [place] Abdomen Limited Ruq  Result Date: 10/30/2016 CLINICAL DATA:  Nausea, vomiting, epigastric pain, elevated bilirubin EXAM: ULTRASOUND ABDOMEN LIMITED RIGHT UPPER QUADRANT COMPARISON:  None. FINDINGS: Gallbladder: No gallstones or wall thickening visualized. No sonographic Murphy sign noted by sonographer. Common bile duct: Diameter: 2.6 mm diameter, unremarkable Liver: No focal lesion identified. Within normal limits in parenchymal echogenicity. IMPRESSION: Negative.  Normal gallbladder Electronically Signed   By: Corlis Leak  Hassell M.D.   On: 10/30/2016 20:43    Procedures Procedures (including critical care time)  Medications Ordered in ED Medications  ondansetron (ZOFRAN) injection 4 mg (4 mg Intravenous Given 10/30/16 1928)  sodium chloride 0.9 % bolus 1,000 mL (0 mLs Intravenous Stopped 10/30/16 2013)  metoCLOPramide (REGLAN) injection 10 mg  (10 mg Intravenous Given 10/30/16 2012)  gi cocktail (Maalox,Lidocaine,Donnatal) (30 mLs Oral Given 10/30/16 2130)     Initial Impression / Assessment and Plan / ED Course  I have reviewed the triage vital signs and the nursing notes.  Pertinent labs & imaging results that were available during my care of the patient were reviewed by me and considered in my medical decision making (see chart for details).     Patient is nontoxic, nonseptic appearing, in no apparent distress.  Patient's pain and other symptoms adequately managed in emergency department.  Fluid bolus given.  Labs, imaging and vitals reviewed.  AST 75; T. Bili 1.3. RUQ US unremarkable for cholecystitis or choledocholithiasis. Patient does not meet the SIRS or Sepsis criteria.  On repeat exam patient does not have a surgical abdomen and there are no peritoneal signs.  No indication of appendicitis, bowel obstruction, bowel perforation, diverticulitis.  Successfully PO challenged. Patient discharged home with symptomatic treatment  and given strict instructions for follow-up with their primary care physician.  I have also discussed reasons to return immediately to the ER.  Patient expresses understanding and agrees with plan.  Final Clinical Impressions(s) / ED Diagnoses   Final diagnoses:  Non-intractable vomiting with nausea, unspecified vomiting type    New Prescriptions Discharge Medication List as of 10/30/2016 10:31 PM    START taking these medications   Details  lidocaine (XYLOCAINE) 2 % solution Use as directed 15 mLs in the mouth or throat every 4 (four) hours as needed for mouth pain., Starting Mon 10/30/2016, Print    ondansetron (ZOFRAN ODT) 4 MG disintegrating tablet Take 1 tablet (4 mg total) by mouth every 8 (eight) hours as needed for nausea or vomiting., Starting Mon 10/30/2016, Print      I personally performed the services described in this documentation, which was scribed in my presence. The recorded  information has been reviewed and is accurate.     Frederik Pear A, PA-C 10/31/16 0136    Rolan Bucco, MD 11/02/16 1601

## 2016-10-30 NOTE — Discharge Instructions (Signed)
If you develop new or worsening symptoms, including vomiting and nausea despite taking Zofran, the inability to keep down food or liquids, or fever and chills, please return to the emergency department for reevaluation.  Please call [facility] wellness to get established with a primary care provider for follow-up. If you continue to have abdominal pain, you can also schedule a follow up appointment with [facility]. You were given a GI cocktail today with Maalox (available over the counter, and donnatal).

## 2016-10-30 NOTE — ED Notes (Signed)
States is feeling better, given gingerale

## 2016-10-30 NOTE — ED Triage Notes (Signed)
C/o abd pain, n/v sicne 11am-presents to triage in w/c

## 2016-11-01 ENCOUNTER — Emergency Department (HOSPITAL_BASED_OUTPATIENT_CLINIC_OR_DEPARTMENT_OTHER): Payer: Self-pay

## 2016-11-01 ENCOUNTER — Emergency Department (HOSPITAL_BASED_OUTPATIENT_CLINIC_OR_DEPARTMENT_OTHER)
Admission: EM | Admit: 2016-11-01 | Discharge: 2016-11-01 | Disposition: A | Discharge status: Home / Self Care | Payer: Self-pay | Attending: Emergency Medicine | Admitting: Emergency Medicine

## 2016-11-01 ENCOUNTER — Encounter (HOSPITAL_BASED_OUTPATIENT_CLINIC_OR_DEPARTMENT_OTHER): Payer: Self-pay

## 2016-11-01 DIAGNOSIS — E876 Hypokalemia: Secondary | ICD-10-CM | POA: Insufficient documentation

## 2016-11-01 DIAGNOSIS — K2900 Acute gastritis without bleeding: Secondary | ICD-10-CM | POA: Insufficient documentation

## 2016-11-01 DIAGNOSIS — R112 Nausea with vomiting, unspecified: Secondary | ICD-10-CM | POA: Insufficient documentation

## 2016-11-01 DIAGNOSIS — J45909 Unspecified asthma, uncomplicated: Secondary | ICD-10-CM | POA: Insufficient documentation

## 2016-11-01 LAB — CBC WITH DIFFERENTIAL/PLATELET
BASOS ABS: 0 10*3/uL (ref 0.0–0.1)
BASOS PCT: 0 %
EOS ABS: 0 10*3/uL (ref 0.0–0.7)
EOS PCT: 0 %
HCT: 49.5 % (ref 39.0–52.0)
Hemoglobin: 18.3 g/dL — ABNORMAL HIGH (ref 13.0–17.0)
Lymphocytes Relative: 17 %
Lymphs Abs: 1.8 10*3/uL (ref 0.7–4.0)
MCH: 31.3 pg (ref 26.0–34.0)
MCHC: 37 g/dL — ABNORMAL HIGH (ref 30.0–36.0)
MCV: 84.8 fL (ref 78.0–100.0)
MONO ABS: 1 10*3/uL (ref 0.1–1.0)
Monocytes Relative: 10 %
Neutro Abs: 7.6 10*3/uL (ref 1.7–7.7)
Neutrophils Relative %: 72 %
PLATELETS: 164 10*3/uL (ref 150–400)
RBC: 5.84 MIL/uL — ABNORMAL HIGH (ref 4.22–5.81)
RDW: 12.8 % (ref 11.5–15.5)
WBC: 10.4 10*3/uL (ref 4.0–10.5)

## 2016-11-01 LAB — COMPREHENSIVE METABOLIC PANEL
ALT: 45 U/L (ref 17–63)
AST: 39 U/L (ref 15–41)
Albumin: 5.5 g/dL — ABNORMAL HIGH (ref 3.5–5.0)
Alkaline Phosphatase: 47 U/L (ref 38–126)
Anion gap: 15 (ref 5–15)
BILIRUBIN TOTAL: 2.1 mg/dL — AB (ref 0.3–1.2)
BUN: 13 mg/dL (ref 6–20)
CO2: 22 mmol/L (ref 22–32)
CREATININE: 1.02 mg/dL (ref 0.61–1.24)
Calcium: 10.3 mg/dL (ref 8.9–10.3)
Chloride: 101 mmol/L (ref 101–111)
GFR calc Af Amer: >60 mL/min (ref >60)
Glucose, Bld: 121 mg/dL — ABNORMAL HIGH (ref 65–99)
POTASSIUM: 3 mmol/L — AB (ref 3.5–5.1)
Sodium: 138 mmol/L (ref 135–145)
TOTAL PROTEIN: 8.3 g/dL — AB (ref 6.5–8.1)

## 2016-11-01 LAB — URINALYSIS, ROUTINE W REFLEX MICROSCOPIC
Bilirubin Urine: NEGATIVE
Glucose, UA: NEGATIVE mg/dL
Hgb urine dipstick: NEGATIVE
Ketones, ur: >80 mg/dL — AB
LEUKOCYTES UA: NEGATIVE
NITRITE: NEGATIVE
PROTEIN: NEGATIVE mg/dL
Specific Gravity, Urine: 1.02 (ref 1.005–1.030)
pH: 8 (ref 5.0–8.0)

## 2016-11-01 LAB — LIPASE, BLOOD: LIPASE: 29 U/L (ref 11–51)

## 2016-11-01 MED ORDER — OMEPRAZOLE 20 MG PO CPDR: 20.0000 mg | DELAYED_RELEASE_CAPSULE | Freq: Every day | ORAL | 0 refills | Status: DC

## 2016-11-01 MED ORDER — IOPAMIDOL (ISOVUE-300) INJECTION 61%
100.0000 mL | Freq: Once | INTRAVENOUS | Status: AC | PRN
  Administered 2016-11-01: 100 mL via INTRAVENOUS

## 2016-11-01 MED ORDER — ONDANSETRON HCL 4 MG/2ML IJ SOLN
4.0000 mg | Freq: Once | INTRAMUSCULAR | Status: AC
  Administered 2016-11-01: 4 mg via INTRAVENOUS
  Filled 2016-11-01: qty 2

## 2016-11-01 MED ORDER — LACTATED RINGERS IV BOLUS (SEPSIS)
1000.0000 mL | Freq: Once | INTRAVENOUS | Status: AC
  Administered 2016-11-01: 1000 mL via INTRAVENOUS

## 2016-11-01 MED ORDER — PROMETHAZINE HCL 25 MG PO TABS: 25.0000 mg | ORAL_TABLET | Freq: Four times a day (QID) | ORAL | 0 refills | Status: AC | PRN

## 2016-11-01 MED ORDER — PANTOPRAZOLE SODIUM 40 MG IV SOLR
40.0000 mg | Freq: Once | INTRAVENOUS | Status: AC
  Administered 2016-11-01: 40 mg via INTRAVENOUS
  Filled 2016-11-01: qty 40

## 2016-11-01 MED ORDER — PROMETHAZINE HCL 25 MG/ML IJ SOLN
25.0000 mg | Freq: Once | INTRAMUSCULAR | Status: AC
  Administered 2016-11-01: 25 mg via INTRAVENOUS
  Filled 2016-11-01: qty 1

## 2016-11-01 MED ORDER — GI COCKTAIL ~~LOC~~
30.0000 mL | Freq: Once | ORAL | Status: AC
  Administered 2016-11-01: 30 mL via ORAL
  Filled 2016-11-01: qty 30

## 2016-11-01 MED ORDER — SODIUM CHLORIDE 0.9 % IV BOLUS (SEPSIS)
1000.0000 mL | Freq: Once | INTRAVENOUS | Status: AC
  Administered 2016-11-01: 1000 mL via INTRAVENOUS

## 2016-11-01 NOTE — ED Notes (Signed)
Pt unable to take GI cocktail at this time due to nausea, meds given and will attempt later

## 2016-11-01 NOTE — ED Triage Notes (Signed)
C/o n/v x 1.5 hours-seen here for same 2 days ago-presents to triage in w/c

## 2016-11-01 NOTE — ED Notes (Signed)
Pt verbalizes understanding of d/c instructions and denies any further need at this time. 

## 2016-11-01 NOTE — ED Notes (Signed)
Pt tolerated PO fluids

## 2016-11-01 NOTE — ED Provider Notes (Signed)
[facility]   CSN: [id number] Arrival date & time: 11/01/16  1718   By signing my name below, I, Clarisse GougeXavier Herndon, attest that this documentation has been prepared under the direction and in the presence of Tilden Fossaees, Evolet Salminen, MD. Electronically signed, Clarisse GougeXavier Herndon, ED Scribe. 11/01/16. 5:51 PM.   History   Chief Complaint Chief Complaint  Patient presents with  . Emesis   The history is provided by the patient, medical records and a relative. No language interpreter was used.    Dillon Martin is a 26 y.o. male presenting to the Emergency Department concerning recurrent, persistent N/V onset 1.5 hours PTA. He states this has been recurrent issue x ~1-2 months. Pt reports "a lot" of dark vomit today and "a lot" of "stomach acid vomit" 2 days ago. Associated headaches and face tingling reported today. He states he has only had yogurt and water today. Pt describes burning, constant LUQ abdominal pain worse with vomiting spells. He also notes diaphoresis during vomiting spells. No relief to N/V with zofran last night. Pt unsure of suspicious food intake. [facility] when he was prescribed zofran and given a GI cocktail during evaluation. He states he drinks filtered tap water from a city source. No recent long distance travel or camping noted. FMHx of "heart problems", DM and HTN noted by pt's brother. No daily medications. No h/o abdominal surgeries. No tobacco, ETOH or marijuana use noted. No black or bloody stools. No other complaints at this time.   Past Medical History:  Diagnosis Date  . Chicken pox   . Childhood asthma   . GERD (gastroesophageal reflux disease)   . Seasonal allergies     There are no active problems to display for this patient.   Past Surgical History:  Procedure Laterality Date  . ADENOIDECTOMY  2000  . ANKLE SURGERY  2006   Pins in ankles to correct flat feet  . INGUINAL HERNIA REPAIR  1998  . NASAL  SEPTUM SURGERY  2013  . WISDOM TOOTH EXTRACTION  2012       Home Medications    Prior to Admission medications   Medication Sig Start Date End Date Taking? Authorizing Provider  lidocaine (XYLOCAINE) 2 % solution Use as directed 15 mLs in the mouth or throat every 4 (four) hours as needed for mouth pain. 10/30/16   McDonald, Mia A, PA-C  omeprazole (PRILOSEC) 20 MG capsule Take 1 capsule (20 mg total) by mouth daily. 11/01/16   Tilden Fossaees, Ivey Cina, MD  ondansetron (ZOFRAN ODT) 4 MG disintegrating tablet Take 1 tablet (4 mg total) by mouth every 8 (eight) hours as needed for nausea or vomiting. 10/30/16   McDonald, Mia A, PA-C  promethazine (PHENERGAN) 25 MG tablet Take 1 tablet (25 mg total) by mouth every 6 (six) hours as needed for nausea or vomiting. 11/01/16   Tilden Fossaees, Jamerson Vonbargen, MD    Family History Family History  Problem Relation Age of Onset  . Hypertension Mother   . Heart disease Maternal Grandfather   . Hypertension Maternal Grandfather   . Diabetes Maternal Grandfather     Social History Social History  Substance Use Topics  . Smoking status: Never Smoker  . Smokeless tobacco: Never Used  . Alcohol use No     Allergies   Amoxicillin   Review of Systems Review of Systems  Cardiovascular: Negative for chest pain.  Gastrointestinal: Positive for nausea and vomiting. Negative for blood in stool.  Neurological: Positive for numbness (tingling) and headaches. Negative for weakness.  All other systems reviewed and are negative.    Physical Exam Updated Vital Signs BP (!) 170/107   Pulse 98   Temp 98.1 F (36.7 C) (Oral)   Resp (!) 22   Ht 5\' 11"  (1.803 m)   Wt 190 lb (86.2 kg)   SpO2 100%   BMI 26.50 kg/m   Physical Exam  Constitutional: He is oriented to person, place, and time. He appears well-developed and well-nourished.  Uncomfortable appearing  HENT:  Head: Normocephalic and atraumatic.  Cardiovascular: Normal rate and regular rhythm.   No murmur  heard. Pulmonary/Chest: Effort normal and breath sounds normal. No respiratory distress.  Abdominal: Soft. There is tenderness (mild) in the epigastric area. There is no rebound and no guarding.  Musculoskeletal: He exhibits no edema or tenderness.  Neurological: He is alert and oriented to person, place, and time.  Skin: Skin is warm and dry.  Psychiatric: He has a normal mood and affect. His behavior is normal.  Nursing note and vitals reviewed.    ED Treatments / Results  DIAGNOSTIC STUDIES: Oxygen Saturation is 100% on RA, NL by my interpretation.    COORDINATION OF CARE: 5:44 PM-Discussed next steps with pt. Pt verbalized understanding and is agreeable with the plan. Will order labs and medication.   Labs (all labs ordered are listed, but only abnormal results are displayed) Labs Reviewed  COMPREHENSIVE METABOLIC PANEL - Abnormal; Notable for the following:       Result Value   Potassium 3.0 (*)    Glucose, Bld 121 (*)    Total Protein 8.3 (*)    Albumin 5.5 (*)    Total Bilirubin 2.1 (*)    All other components within normal limits  CBC WITH DIFFERENTIAL/PLATELET - Abnormal; Notable for the following:    RBC 5.84 (*)    Hemoglobin 18.3 (*)    MCHC 37.0 (*)    All other components within normal limits  URINALYSIS, ROUTINE W REFLEX MICROSCOPIC - Abnormal; Notable for the following:    Ketones, ur >80 (*)    All other components within normal limits  LIPASE, BLOOD    EKG  EKG Interpretation None       Radiology Ct Abdomen Pelvis W Contrast  Result Date: 11/01/2016 CLINICAL DATA:  Left upper quadrant pain with nausea and vomiting for 2 days. History of asthma, gastroesophageal reflux disease, and inguinal hernia repair. EXAM: CT ABDOMEN AND PELVIS WITH CONTRAST TECHNIQUE: Multidetector CT imaging of the abdomen and pelvis was performed using the standard protocol following bolus administration of intravenous contrast. CONTRAST:  ISOVUE-300 IOPAMIDOL  (ISOVUE-300) INJECTION 61% COMPARISON:  None. FINDINGS: Lower chest: No acute abnormality. Hepatobiliary: No focal liver abnormality is seen. No gallstones, gallbladder wall thickening, or biliary dilatation. Pancreas: Unremarkable. No pancreatic ductal dilatation or surrounding inflammatory changes. Spleen: Normal in size without focal abnormality. Adrenals/Urinary Tract: Adrenal glands are unremarkable. Kidneys are normal, without renal calculi, focal lesion, or hydronephrosis. Bladder is unremarkable. Stomach/Bowel: Stomach is within normal limits. Appendix appears normal. No evidence of bowel wall thickening, distention, or inflammatory changes. Vascular/Lymphatic: No significant vascular findings are present. No enlarged abdominal or pelvic lymph nodes. Reproductive: Prostate gland is mildly enlarged for age, measuring about 4.3 cm diameter. Other: No abdominal wall hernia or abnormality. No abdominopelvic ascites. Musculoskeletal: No acute or significant osseous findings. IMPRESSION: No acute process demonstrated in the abdomen or pelvis. No evidence of bowel obstruction or inflammation. Mild  prostate gland enlargement of nonspecific etiology. Electronically Signed   By: Burman NievesWilliam  Stevens M.D.   On: 11/01/2016 20:43    Procedures Procedures (including critical care time)  Medications Ordered in ED Medications  sodium chloride 0.9 % bolus 1,000 mL (0 mLs Intravenous Stopped 11/01/16 1908)  ondansetron (ZOFRAN) injection 4 mg (4 mg Intravenous Given 11/01/16 1810)  pantoprazole (PROTONIX) injection 40 mg (40 mg Intravenous Given 11/01/16 1810)  gi cocktail (Maalox,Lidocaine,Donnatal) (30 mLs Oral Given 11/01/16 1923)  lactated ringers bolus 1,000 mL (0 mLs Intravenous Stopped 11/01/16 2059)  promethazine (PHENERGAN) injection 25 mg (25 mg Intravenous Given 11/01/16 1924)  iopamidol (ISOVUE-300) 61 % injection 100 mL (100 mLs Intravenous Contrast Given 11/01/16 2026)     Initial Impression / Assessment  and Plan / ED Course  I have reviewed the triage vital signs and the nursing notes.  Pertinent labs & imaging results that were available during my care of the patient were reviewed by me and considered in my medical decision making (see chart for details).     Patient here for evaluation of nausea, vomiting, epigastric discomfort. Recently seen in the ED 2 days ago with recurrent symptoms. Mild hypokalemia and hemoglobin concentration consistent with dehydration. Patient with recurrent vomiting on recheck. CT abdomen obtained with no acute abnormalities. Following Phenergan administration his nausea and vomiting have resolved. Presentation is not consistent with cholecystitis, pancreatitis, appendicitis. Discussed with patient likely gastritis will treat with antibiotic, PPI. Discussed GI follow-up, home care, return precautions.  Final Clinical Impressions(s) / ED Diagnoses   Final diagnoses:  Non-intractable vomiting with nausea, unspecified vomiting type  Hypokalemia  Acute gastritis without hemorrhage, unspecified gastritis type    New Prescriptions Discharge Medication List as of 11/01/2016  9:49 PM    START taking these medications   Details  omeprazole (PRILOSEC) 20 MG capsule Take 1 capsule (20 mg total) by mouth daily., Starting Wed 11/01/2016, Print    promethazine (PHENERGAN) 25 MG tablet Take 1 tablet (25 mg total) by mouth every 6 (six) hours as needed for nausea or vomiting., Starting Wed 11/01/2016, Print      I personally performed the services described in this documentation, which was scribed in my presence. The recorded information has been reviewed and is accurate.    Tilden Fossaees, Zarai Orsborn, MD 11/01/16 [id number]

## 2016-12-28 ENCOUNTER — Encounter: Payer: Self-pay | Admitting: Physician Assistant

## 2017-01-01 ENCOUNTER — Encounter: Payer: Self-pay | Admitting: Physician Assistant

## 2017-01-01 ENCOUNTER — Encounter (INDEPENDENT_AMBULATORY_CARE_PROVIDER_SITE_OTHER): Payer: Self-pay

## 2017-01-01 ENCOUNTER — Ambulatory Visit (INDEPENDENT_AMBULATORY_CARE_PROVIDER_SITE_OTHER): Payer: BLUE CROSS/BLUE SHIELD | Admitting: Physician Assistant

## 2017-01-01 ENCOUNTER — Other Ambulatory Visit: Payer: BLUE CROSS/BLUE SHIELD

## 2017-01-01 VITALS — BP 124/78 | HR 100 | Ht 71.25 in | Wt 176.0 lb

## 2017-01-01 DIAGNOSIS — R197 Diarrhea, unspecified: Secondary | ICD-10-CM | POA: Diagnosis not present

## 2017-01-01 DIAGNOSIS — R1084 Generalized abdominal pain: Secondary | ICD-10-CM

## 2017-01-01 DIAGNOSIS — R112 Nausea with vomiting, unspecified: Secondary | ICD-10-CM | POA: Diagnosis not present

## 2017-01-01 MED ORDER — ONDANSETRON 4 MG PO TBDP: 4.0000 mg | ORAL_TABLET | Freq: Three times a day (TID) | ORAL | 1 refills | Status: DC | PRN

## 2017-01-01 MED ORDER — OMEPRAZOLE 40 MG PO CPDR: 40.0000 mg | DELAYED_RELEASE_CAPSULE | Freq: Two times a day (BID) | ORAL | 1 refills | Status: DC

## 2017-01-01 MED ORDER — ONDANSETRON 4 MG PO TBDP: 4.0000 mg | ORAL_TABLET | Freq: Three times a day (TID) | ORAL | 1 refills | Status: AC | PRN

## 2017-01-01 MED ORDER — OMEPRAZOLE 40 MG PO CPDR: 40.0000 mg | DELAYED_RELEASE_CAPSULE | Freq: Two times a day (BID) | ORAL | 1 refills | Status: AC

## 2017-01-01 NOTE — Progress Notes (Signed)
Chief Complaint: Nausea, Vomiting, Abdominal pain, Diarrhea  HPI:  Mr. Dillon Martin is a 26 year old Caucasian male with a past medical history as listed below, who was referred to me by Copland, Gwenlyn Found, MD for a complaint of abdominal pain, nausea, vomiting and diarrhea.      Per review of chart patient has been seen in the ER on 2 separate occasions, 2 days apart for these same symptoms. The last was 11/01/16. At that time, labs showed a potassium low at 3 and a total bilirubin minimally elevated at 2.1, CBC showed an elevated hemoglobin 8.3 and urinalysis showed some ketones. Lipase was normal. Patient had CT of the abdomen and pelvis with contrast which showed no acute process demonstrating the abdomen or pelvis. No evidence of bowel obstruction or inflammation. No focal liver abnormality, gallstones, but gallbladder wall thickening or biliary dilatation. Patient was diagnosed with likely gastritis given Prilosec 20 mg daily as well as Phenergan 25 mg every 6 hours as needed.    Today, the patient presents to clinic and tells me that a week after being seen in the ER he was "back to normal" mostly. Then at the beginning of last week 12/26/16 he started again with all of his symptoms. He notes that he was vomiting on Tuesday all day off and on this continued into the night and throughout the next day. Recently, the patient has been able to abate the symptoms with Phenergan and Omeprazole. He has not been able to eat much over the past week and has remained on a bland diet, which does not seem to help. He does note that he is feeling somewhat better on Monday and ate a burger which he made at home but this seemed to "set everything off again". Patient tells me yesterday he was vomiting in the morning and took a Phenergan and some Mylanta and went to sleep and when he woke up he had no further vomiting episodes. Patient notes a 10 pound weight loss over the past week. He also describes his stools as "mostly  diarrhea/runny". He does tell me these are darker in color but denies any melena. Patient also describes an increase in reflux symptoms irregardless of his Omeprazole 20 mg daily.    Patient's social history is positive for a lot of stress and anxiety with his job. Last month his symptoms seemed to start while working in a very stressful job which he has since quit. Past medical history positive for inguinal hernia repair at the age of 6-mother questioned relation to this.   Patient denies fever, chills, blood in his stool, melena, anorexia, change in diet or symptoms that awaken him at night.  Past Medical History:  Diagnosis Date  . Chicken pox   . Childhood asthma   . GERD (gastroesophageal reflux disease)   . Seasonal allergies     Past Surgical History:  Procedure Laterality Date  . ADENOIDECTOMY  2000  . ANKLE SURGERY Bilateral 2006   Pins in ankles to correct flat feet  . INGUINAL HERNIA REPAIR  1998  . NASAL SEPTUM SURGERY  2013  . WISDOM TOOTH EXTRACTION  2012    Current Outpatient Prescriptions  Medication Sig Dispense Refill  . lidocaine (XYLOCAINE) 2 % solution Use as directed 15 mLs in the mouth or throat every 4 (four) hours as needed for mouth pain. 100 mL 0  . promethazine (PHENERGAN) 25 MG tablet Take 1 tablet (25 mg total) by mouth every 6 (six) hours as needed  for nausea or vomiting. 12 tablet 0  . omeprazole (PRILOSEC) 40 MG capsule Take 1 capsule (40 mg total) by mouth 2 (two) times daily before a meal. 60 capsule 1  . ondansetron (ZOFRAN ODT) 4 MG disintegrating tablet Take 1 tablet (4 mg total) by mouth every 8 (eight) hours as needed for nausea or vomiting. 90 tablet 1   No current facility-administered medications for this visit.     Allergies as of 01/01/2017 - Review Complete 01/01/2017  Allergen Reaction Noted  . Amoxicillin  09/16/2015    Family History  Problem Relation Age of Onset  . Hypertension Mother   . Heart disease Maternal Grandfather     . Hypertension Maternal Grandfather   . Diabetes Maternal Grandfather   . Colon cancer Neg Hx   . Esophageal cancer Neg Hx     Social History   Social History  . Marital status: Single    Spouse name: N/A  . Number of children: N/A  . Years of education: N/A   Occupational History  . Chef    Social History Main Topics  . Smoking status: Never Smoker  . Smokeless tobacco: Never Used  . Alcohol use No  . Drug use: No  . Sexual activity: Not on file   Other Topics Concern  . Not on file   Social History Narrative  . No narrative on file    Review of Systems:    Constitutional: No fever Skin: No rash  Cardiovascular: No chest pain Respiratory: No SOB  Gastrointestinal: See HPI and otherwise negative Genitourinary: No dysuria  Neurological: No headache Musculoskeletal: No new muscle or joint pain Hematologic: No bleeding  Psychiatric: No history of depression or anxiety   Physical Exam:  Vital signs: BP 124/78   Pulse 100   Ht 5' 11.25" (1.81 m)   Wt 176 lb (79.8 kg)   BMI 24.38 kg/m   Constitutional:   Pleasant Caucasian male appears to be in NAD, Well developed, Well nourished, alert and cooperative Head:  Normocephalic and atraumatic. Eyes:   PEERL, EOMI. No icterus. Conjunctiva pink. Ears:  Normal auditory acuity. Neck:  Supple Throat: Oral cavity and pharynx without inflammation, swelling or lesion.  Respiratory: Respirations even and unlabored. Lungs clear to auscultation bilaterally.   No wheezes, crackles, or rhonchi.  Cardiovascular: Normal S1, S2. No MRG. Regular rate and rhythm. No peripheral edema, cyanosis or pallor.  Gastrointestinal:  Soft, nondistended, mild generalized ttp. No rebound or guarding. Normal bowel sounds. No appreciable masses or hepatomegaly. Rectal:  Not performed.  Msk:  Symmetrical without gross deformities. Without edema, no deformity or joint abnormality.  Neurologic:  Alert and  oriented x4;  grossly normal  neurologically.  Skin:   Dry and intact without significant lesions or rashes. Psychiatric: Demonstrates good judgement and reason without abnormal affect or behaviors.  MOST RECENT LABS AND IMAGING: CBC    Component Value Date/Time   WBC 10.4 11/01/2016 1807   RBC 5.84 (H) 11/01/2016 1807   HGB 18.3 (H) 11/01/2016 1807   HCT 49.5 11/01/2016 1807   PLT 164 11/01/2016 1807   MCV 84.8 11/01/2016 1807   MCH 31.3 11/01/2016 1807   MCHC 37.0 (H) 11/01/2016 1807   RDW 12.8 11/01/2016 1807   LYMPHSABS 1.8 11/01/2016 1807   MONOABS 1.0 11/01/2016 1807   EOSABS 0.0 11/01/2016 1807   BASOSABS 0.0 11/01/2016 1807    CMP     Component Value Date/Time   NA 138 11/01/2016 1807  K 3.0 (L) 11/01/2016 1807   CL 101 11/01/2016 1807   CO2 22 11/01/2016 1807   GLUCOSE 121 (H) 11/01/2016 1807   BUN 13 11/01/2016 1807   CREATININE 1.02 11/01/2016 1807   CALCIUM 10.3 11/01/2016 1807   PROT 8.3 (H) 11/01/2016 1807   ALBUMIN 5.5 (H) 11/01/2016 1807   AST 39 11/01/2016 1807   ALT 45 11/01/2016 1807   ALKPHOS 47 11/01/2016 1807   BILITOT 2.1 (H) 11/01/2016 1807   GFRNONAA >60 11/01/2016 1807   GFRAA >60 11/01/2016 1807   Ct Abdomen Pelvis W Contrast  Result Date: 11/01/2016 CLINICAL DATA:  Left upper quadrant pain with nausea and vomiting for 2 days. History of asthma, gastroesophageal reflux disease, and inguinal hernia repair. EXAM: CT ABDOMEN AND PELVIS WITH CONTRAST TECHNIQUE: Multidetector CT imaging of the abdomen and pelvis was performed using the standard protocol following bolus administration of intravenous contrast. CONTRAST:  ISOVUE-300 IOPAMIDOL (ISOVUE-300) INJECTION 61% COMPARISON:  None. FINDINGS: Lower chest: No acute abnormality. Hepatobiliary: No focal liver abnormality is seen. No gallstones, gallbladder wall thickening, or biliary dilatation. Pancreas: Unremarkable. No pancreatic ductal dilatation or surrounding inflammatory changes. Spleen: Normal in size without focal  abnormality. Adrenals/Urinary Tract: Adrenal glands are unremarkable. Kidneys are normal, without renal calculi, focal lesion, or hydronephrosis. Bladder is unremarkable. Stomach/Bowel: Stomach is within normal limits. Appendix appears normal. No evidence of bowel wall thickening, distention, or inflammatory changes. Vascular/Lymphatic: No significant vascular findings are present. No enlarged abdominal or pelvic lymph nodes. Reproductive: Prostate gland is mildly enlarged for age, measuring about 4.3 cm diameter. Other: No abdominal wall hernia or abnormality. No abdominopelvic ascites. Musculoskeletal: No acute or significant osseous findings. IMPRESSION: No acute process demonstrated in the abdomen or pelvis. No evidence of bowel obstruction or inflammation. Mild prostate gland enlargement of nonspecific etiology. Electronically Signed   By: Burman Nieves M.D.   On: 11/01/2016 20:43   Assessment: 1. Abdominal pain: Generalized, patient describes as more of a "ache", likely related to diarrhea and nausea vomiting 2. Nausea and vomiting: Intractable at times, currently controlled with Phenergan and Mylanta, CT abdomen pelvis normal, labs show dehydration and otherwise normal; consider H. pylori versus IBS versus gastritis versus gallbladder etiology 3. Diarrhea: Consider relation to IBS versus gastritis versus H. pylori versus other  Plan: 1. Reviewed ER workup including CT abdomen pelvis and labs. Agree that gastritis is most likely, question whether this is H pylori. Would prefer patient start Omeprazole 40 mg twice a day for the next 3-4 weeks rather than undergo fecal testing at this time. If he is no better at follow-up would recommend he discontinue PPI and we can order this testing. 2. Ordered stool studies to include a GI pathogen panel and O&P. 3. Increased patient's Omeprazole to 40 mg twice a day, 30-60 minutes before breakfast and dinner. #60 with 3 refills 4. Recommend patient schedule  Zofran 4 mg q8h, refilled prescription #90 with 1 refill 5. Patient to follow in clinic with me in 3-4 weeks or sooner if necessary. He was assigned at Dr. Myrtie Neither this morning as he is supervising. If the patient is no better to consider right upper quadrant ultrasound vs EGD vs other.  Hyacinth Meeker, PA-C [facility] 01/01/2017, 1:13 PM  Cc: Pearline Cables, MD

## 2017-01-01 NOTE — Patient Instructions (Signed)
We have sent the following medications to your pharmacy for you to pick up at your convenience: omperazole 40 mg twice a day 30- 60 mins before breakfast and dinner  Zofran 4 mg every 8 hours as needed for nausea   Your physician has requested that you go to the basement for lab work before leaving today.

## 2017-01-02 NOTE — Progress Notes (Signed)
Thank you for sending this case to me. I have reviewed the entire note, and the outlined plan seems appropriate.   Quindarrius Joplin Danis, MD  

## 2017-01-03 ENCOUNTER — Other Ambulatory Visit: Payer: BLUE CROSS/BLUE SHIELD

## 2017-01-03 DIAGNOSIS — R1084 Generalized abdominal pain: Secondary | ICD-10-CM

## 2017-01-03 DIAGNOSIS — R197 Diarrhea, unspecified: Secondary | ICD-10-CM

## 2017-01-03 DIAGNOSIS — R112 Nausea with vomiting, unspecified: Secondary | ICD-10-CM

## 2017-01-04 LAB — GASTROINTESTINAL PATHOGEN PANEL PCR
C. difficile Tox A/B, PCR: NOT DETECTED
CAMPYLOBACTER, PCR: NOT DETECTED
Cryptosporidium, PCR: NOT DETECTED
E COLI 0157, PCR: NOT DETECTED
E coli (ETEC) LT/ST PCR: NOT DETECTED
E coli (STEC) stx1/stx2, PCR: NOT DETECTED
Giardia lamblia, PCR: NOT DETECTED
NOROVIRUS, PCR: NOT DETECTED
ROTAVIRUS, PCR: NOT DETECTED
SALMONELLA, PCR: NOT DETECTED
SHIGELLA, PCR: NOT DETECTED

## 2017-01-04 LAB — OVA AND PARASITE EXAMINATION
CONCENTRATE RESULT: NONE SEEN
SPECIMEN QUALITY:: ADEQUATE
TRICHROME RESULT:: NONE SEEN
VKL: 81066999

## 2017-02-05 ENCOUNTER — Ambulatory Visit: Payer: BLUE CROSS/BLUE SHIELD | Admitting: Physician Assistant

## 2017-02-13 ENCOUNTER — Encounter: Payer: Self-pay | Admitting: Physician Assistant

## 2017-02-13 ENCOUNTER — Ambulatory Visit (INDEPENDENT_AMBULATORY_CARE_PROVIDER_SITE_OTHER): Payer: BLUE CROSS/BLUE SHIELD | Admitting: Physician Assistant

## 2017-02-13 VITALS — BP 106/60 | HR 76 | Ht 70.08 in | Wt 202.0 lb

## 2017-02-13 DIAGNOSIS — G43A0 Cyclical vomiting, not intractable: Secondary | ICD-10-CM | POA: Diagnosis not present

## 2017-02-13 DIAGNOSIS — R1084 Generalized abdominal pain: Secondary | ICD-10-CM | POA: Diagnosis not present

## 2017-02-13 DIAGNOSIS — R197 Diarrhea, unspecified: Secondary | ICD-10-CM | POA: Diagnosis not present

## 2017-02-13 NOTE — Progress Notes (Signed)
Thank you for sending this case to me. I have reviewed the entire note, and the outlined plan seems appropriate.   Montserrat Shek Danis, MD  

## 2017-02-13 NOTE — Patient Instructions (Signed)

## 2017-02-13 NOTE — Progress Notes (Signed)
Chief Complaint: Follow-up for nausea and vomiting, abdominal pain and diarrhea  HPI:   Mr. Dillon Martin is a 26 year old Caucasian male with a past medical history as listed below, who returns to clinic today for follow-up of his abdominal pain, nausea, vomiting and diarrhea.    Please recall patient was last seen in clinic 01/01/17 by me and at that time was assigned to Dr. Myrtie Neitheranis.  He recently had been to the ER on 11/01/16 for symptoms of nausea vomiting and diarrhea.  Patient had been started on Prilosec 20 mg daily after normal workup including CT, CBC and CMP as well as lipase.  At that time patient had improved some with the use of Omeprazole but did continue with some vomiting episodes after certain foods.  Patient had noted a lot of stress and anxiety with his job over the past few months and thought this could be related.  Patient's Omeprazole was increased to 40 mg twice daily and stool studies were ordered to include a GI pathogen panel and O&P.  It was also recommended he schedule Zofran 4 mg every 8 hrs.  It was discussed if he had no improvement we could consider EGD.    On 01/03/17 GI pathogen panel and ova and parasites returned negative/normal.    Today, the patient presents to clinic and explains that he has continued with most of his symptoms.  He notes that he has cycles of nausea and vomiting which can be worse on any given day.  Patient describes being away on a trip to [place] last weekend and waking up and having nausea and vomiting all day.  Patient did not have his Zofran on hand which "does not really help anyways", and took some Bonine and later developed hives that went up and down his arms.  These went down about 2-3 hours later, per the patient, and were gone by 4 AM the next day.  He then was sick the entirety of the next day with nausea and vomiting and associated abdominal pain.  He was able to make it home on the airplane and has not had a severe episode since but does continue  with intermittent nausea.  Patient has tried to tie this to things he is eating and/or not eating but eats mostly a clean diet including grilled chicken and rice and will have these symptoms irregardless.  Patient has been using his Prilosec 40 mg twice a day and Zofran 4 mg every 6 hours but this does not abate his symptoms.   Patient does tell me that he no longer has diarrhea.   Patient's job is far less stressful, his mother is concerned about his ongoing symptoms.   Patient denies fever, chills, blood in the stool, melena, continued weight loss, anorexia, heartburn or reflux.  Past Medical History:  Diagnosis Date  . Chicken pox   . Childhood asthma   . GERD (gastroesophageal reflux disease)   . Seasonal allergies     Past Surgical History:  Procedure Laterality Date  . ADENOIDECTOMY  2000  . ANKLE SURGERY Bilateral 2006   Pins in ankles to correct flat feet  . INGUINAL HERNIA REPAIR  1998  . NASAL SEPTUM SURGERY  2013  . WISDOM TOOTH EXTRACTION  2012    Current Outpatient Medications  Medication Sig Dispense Refill  . lidocaine (XYLOCAINE) 2 % solution Use as directed 15 mLs in the mouth or throat every 4 (four) hours as needed for mouth pain. 100 mL 0  .  omeprazole (PRILOSEC) 40 MG capsule Take 1 capsule (40 mg total) by mouth 2 (two) times daily before a meal. 60 capsule 1  . ondansetron (ZOFRAN ODT) 4 MG disintegrating tablet Take 1 tablet (4 mg total) by mouth every 8 (eight) hours as needed for nausea or vomiting. 90 tablet 1  . promethazine (PHENERGAN) 25 MG tablet Take 1 tablet (25 mg total) by mouth every 6 (six) hours as needed for nausea or vomiting. 12 tablet 0   No current facility-administered medications for this visit.     Allergies as of 02/13/2017 - Review Complete 01/01/2017  Allergen Reaction Noted  . Amoxicillin  09/16/2015    Family History  Problem Relation Age of Onset  . Hypertension Mother   . Heart disease Maternal Grandfather   . Hypertension  Maternal Grandfather   . Diabetes Maternal Grandfather   . Colon cancer Neg Hx   . Esophageal cancer Neg Hx     Social History   Socioeconomic History  . Marital status: Single    Spouse name: Not on file  . Number of children: Not on file  . Years of education: Not on file  . Highest education level: Not on file  Social Needs  . Financial resource strain: Not on file  . Food insecurity - worry: Not on file  . Food insecurity - inability: Not on file  . Transportation needs - medical: Not on file  . Transportation needs - non-medical: Not on file  Occupational History  . Occupation: Chef  Tobacco Use  . Smoking status: Never Smoker  . Smokeless tobacco: Never Used  Substance and Sexual Activity  . Alcohol use: No    Alcohol/week: 0.0 oz  . Drug use: No  . Sexual activity: Not on file  Other Topics Concern  . Not on file  Social History Narrative  . Not on file    Review of Systems:    Constitutional: No weight loss, fever or chills Cardiovascular: No chest pain Respiratory: No SOB  Gastrointestinal: See HPI and otherwise negative   Physical Exam:  Vital signs: Ht 5' 10.08" (1.78 m) Comment: height measured without shoes  Wt 202 lb (91.6 kg)   BMI 28.92 kg/m   Constitutional:   Pleasant Caucasian male appears to be in NAD, Well developed, Well nourished, alert and cooperative Respiratory: Respirations even and unlabored. Lungs clear to auscultation bilaterally.   No wheezes, crackles, or rhonchi.  Cardiovascular: Normal S1, S2. No MRG. Regular rate and rhythm. No peripheral edema, cyanosis or pallor.  Gastrointestinal:  Soft, nondistended, nontender. No rebound or guarding. Normal bowel sounds. No appreciable masses or hepatomegaly. Psychiatric:  Demonstrates good judgement and reason without abnormal affect or behaviors.  NO recent labs or imaging.  Assessment: 1. Abdominal pain: Continues with epigastric abdominal pain which is related to days of nausea and  vomiting and worse after; consider gastritis versus H. pylori versus muscle strain from vomiting 2. Nausea and vomiting: Cyclical symptoms, days of nausea and also days of nausea and vomiting where the patient cannot leave the bed, no help from Zofran scheduled or increase in Omeprazole to 40 mg twice daily; concern for H pylori versus gallbladder etiology versus other 3. Diarrhea: Mostly resolved per the patient  Plan: 1.  Scheduled patient for an EGD in the [facility] with Dr. Myrtie Neitheranis.  Did discuss risks, benefits, limitations and alternatives and the patient agrees to proceed. 2.  Patient will continue his current medications for now.  We can change these  after endoscopy above.  Likely the patient does not need to be on 40 mg of a PPI twice daily as this does not seem to help with symptoms. 3.  If above is normal, could consider HIDA scan with [facility], could also consider celiac testing 4.  Patient to follow in clinic per recommendations from Dr. Myrtie Neither after time of procedures.  Hyacinth Meeker, PA-C [facility] 02/13/2017, 2:28 PM  Cc: Pearline Cables, MD

## 2017-02-16 ENCOUNTER — Encounter: Payer: Self-pay | Admitting: Gastroenterology

## 2017-02-16 ENCOUNTER — Ambulatory Visit (AMBULATORY_SURGERY_CENTER): Payer: BLUE CROSS/BLUE SHIELD | Admitting: Gastroenterology

## 2017-02-16 ENCOUNTER — Other Ambulatory Visit: Payer: Self-pay

## 2017-02-16 VITALS — BP 105/56 | HR 71 | Temp 98.6°F | Resp 16 | Ht 70.0 in | Wt 202.0 lb

## 2017-02-16 DIAGNOSIS — R1084 Generalized abdominal pain: Secondary | ICD-10-CM | POA: Diagnosis present

## 2017-02-16 DIAGNOSIS — K2950 Unspecified chronic gastritis without bleeding: Secondary | ICD-10-CM | POA: Diagnosis not present

## 2017-02-16 DIAGNOSIS — G43A0 Cyclical vomiting, not intractable: Secondary | ICD-10-CM

## 2017-02-16 DIAGNOSIS — K3189 Other diseases of stomach and duodenum: Secondary | ICD-10-CM | POA: Diagnosis not present

## 2017-02-16 MED ORDER — SODIUM CHLORIDE 0.9 % IV SOLN: 500.0000 mL | INTRAVENOUS | Status: DC

## 2017-02-16 NOTE — Progress Notes (Signed)
Pt's states no medical or surgical changes since previsit or office visit. 

## 2017-02-16 NOTE — Progress Notes (Signed)
A and O x3. Report to RN. Tolerated MAC anesthesia well.Teeth unchanged after procedure.

## 2017-02-16 NOTE — Patient Instructions (Signed)
Discharge instructions given. Biopsies taken. Resume previous medications. YOU HAD AN ENDOSCOPIC PROCEDURE TODAY AT THE [facility]:   Refer to the procedure report that was given to you for any specific questions about what was found during the examination.  If the procedure report does not answer your questions, please call your gastroenterologist to clarify.  If you requested that your care partner not be given the details of your procedure findings, then the procedure report has been included in a sealed envelope for you to review at your convenience later.  YOU SHOULD EXPECT: Some feelings of bloating in the abdomen. Passage of more gas than usual.  Walking can help get rid of the air that was put into your GI tract during the procedure and reduce the bloating. If you had a lower endoscopy (such as a colonoscopy or flexible sigmoidoscopy) you may notice spotting of blood in your stool or on the toilet paper. If you underwent a bowel prep for your procedure, you may not have a normal bowel movement for a few days.  Please Note:  You might notice some irritation and congestion in your nose or some drainage.  This is from the oxygen used during your procedure.  There is no need for concern and it should clear up in a day or so.  SYMPTOMS TO REPORT IMMEDIATELY:   Following upper endoscopy (EGD)  Vomiting of blood or coffee ground material  New chest pain or pain under the shoulder blades  Painful or persistently difficult swallowing  New shortness of breath  Fever of 100F or higher  Black, tarry-looking stools  For urgent or emergent issues, a gastroenterologist can be reached at any hour by calling [phone].   DIET:  We do recommend a small meal at first, but then you may proceed to your regular diet.  Drink plenty of fluids but you should avoid alcoholic beverages for 24 hours.  ACTIVITY:  You should plan to take it easy for the rest of today and you should NOT DRIVE  or use heavy machinery until tomorrow (because of the sedation medicines used during the test).    FOLLOW UP: Our staff will call the number listed on your records the next business day following your procedure to check on you and address any questions or concerns that you may have regarding the information given to you following your procedure. If we do not reach you, we will leave a message.  However, if you are feeling well and you are not experiencing any problems, there is no need to return our call.  We will assume that you have returned to your regular daily activities without incident.  If any biopsies were taken you will be contacted by phone or by letter within the next 1-3 weeks.  Please call us at [phone] if you have not heard about the biopsies in 3 weeks.    SIGNATURES/CONFIDENTIALITY: You and/or your care partner have signed paperwork which will be entered into your electronic medical record.  These signatures attest to the fact that that the information above on your After Visit Summary has been reviewed and is understood.  Full responsibility of the confidentiality of this discharge information lies with you and/or your care-partner. 

## 2017-02-16 NOTE — Op Note (Signed)
[facility] Patient Name: Dillon Martin Procedure Date: 02/16/2017 1:25 PM MRN: [id number] Endoscopist: Sherilyn Cooter L. Myrtie Neither , MD Age: 26 Referring MD:  Date of Birth: 12/06/1990 Gender: Male Account #: [id number] Procedure:                Upper GI endoscopy Indications:              Generalized abdominal pain, cyclical nausea with                            vomiting Medicines:                Monitored Anesthesia Care Procedure:                Pre-Anesthesia Assessment:                           - Prior to the procedure, a History and Physical                            was performed, and patient medications and                            allergies were reviewed. The patient's tolerance of                            previous anesthesia was also reviewed. The risks                            and benefits of the procedure and the sedation                            options and risks were discussed with the patient.                            All questions were answered, and informed consent                            was obtained. Prior Anticoagulants: The patient has                            taken no previous anticoagulant or antiplatelet                            agents. ASA Grade Assessment: II - A patient with                            mild systemic disease. After reviewing the risks                            and benefits, the patient was deemed in                            satisfactory condition to undergo the procedure.  After obtaining informed consent, the endoscope was                            passed under direct vision. Throughout the                            procedure, the patient's blood pressure, pulse, and                            oxygen saturations were monitored continuously. The                            Endoscope was introduced through the mouth, and                            advanced to the second part of duodenum. The  upper                            GI endoscopy was accomplished without difficulty.                            The patient tolerated the procedure (high dose                            sedation required). Scope In: Scope Out: Findings:                 The esophagus was normal.                           Localized inflammation characterized by erythema                            was found in the gastric fundus. Biopsies were                            taken with a cold forceps for histology.                           The exam of the stomach was otherwise normal.                            Biopsies were taken with a col forceps (Sidney                            protocol) to rule out H. pylori.                           The cardia and gastric fundus were normal on                            retroflexion.                           The examined duodenum was normal. Six biopsies for  histology were taken from the second portion of the                            duodenum with a cold forceps for evaluation of                            celiac disease.                           The exam was otherwise without abnormality. Complications:            No immediate complications. Estimated Blood Loss:     Estimated blood loss was minimal. Impression:               - Normal esophagus.                           - Gastritis. Biopsied. This appears more likely to                            be the result of, rather than the cause of,                            vomiting.                           - Normal examined duodenum. Biopsied.                           - The examination was otherwise normal. Recommendation:           - Patient has a contact number available for                            emergencies. The signs and symptoms of potential                            delayed complications were discussed with the                            patient. Return to normal activities  tomorrow.                            Written discharge instructions were provided to the                            patient.                           - Resume previous diet.                           - Continue present medications.                           - Await pathology results with further plans for  treatment and office visit to follow. Henry L. Myrtie Neitheranis, MD 02/16/2017 2:01:48 PM This report has been signed electronically.

## 2017-02-16 NOTE — Progress Notes (Signed)
Called to room to assist during endoscopic procedure.  Patient ID and intended procedure confirmed with present staff. Received instructions for my participation in the procedure from the performing physician.  

## 2017-02-19 ENCOUNTER — Telehealth: Payer: Self-pay | Admitting: *Deleted

## 2017-02-19 ENCOUNTER — Telehealth: Payer: Self-pay

## 2017-02-19 NOTE — Telephone Encounter (Signed)
Left message on answering machine. 

## 2017-02-19 NOTE — Telephone Encounter (Signed)
  Follow up Call-  Call back number 02/16/2017  Post procedure Call Back phone  # [phone]  Permission to leave phone message Yes     Patient questions:  Do you have a fever, pain , or abdominal swelling? No. Pain Score  0 *  Have you tolerated food without any problems? Yes.    Have you been able to return to your normal activities? Yes.    Do you have any questions about your discharge instructions: Diet   No. Medications  No. Follow up visit  No.  Do you have questions or concerns about your Care? No.  Actions: * If pain score is 4 or above: No action needed, pain <4.

## 2017-02-26 ENCOUNTER — Other Ambulatory Visit: Payer: Self-pay

## 2017-02-26 MED ORDER — METOCLOPRAMIDE HCL 5 MG PO TABS: 5.0000 mg | ORAL_TABLET | Freq: Three times a day (TID) | ORAL | 2 refills | Status: AC

## 2017-02-26 MED ORDER — PROCHLORPERAZINE 25 MG RE SUPP: 25.0000 mg | Freq: Two times a day (BID) | RECTAL | 1 refills | Status: AC | PRN

## 2017-04-20 ENCOUNTER — Telehealth: Payer: Self-pay | Admitting: Gastroenterology

## 2017-04-20 NOTE — Telephone Encounter (Signed)
Sorry to hear he is not feeling well.  Sounds like he is having a flare of his cyclic vomiting syndrome.  I think he should use the compazine suppository as recommended.  This will help break the cycle of vomiting so he can take oral meds.   If he did not try the metoclopramide as prescribed after EGD, then he needs to do that as well.  New script should be sent if he did not fill the original one.  ED if above does not settle things down by tomorrow and allow him to keep liquids down.

## 2017-04-20 NOTE — Telephone Encounter (Signed)
Patient calling back in regards of this. States he is still vomiting and thinks there is blood in his vomit.

## 2017-04-20 NOTE — Telephone Encounter (Signed)
Patient has still not tried anything for nausea, even though had instructed him earlier to try medication, he will try the compazine suppository to see if he can't stop the vomiting cycle. He has not tried the Reglan as he was leery of the side effects. He does state he might try it, not sure if pharmacy still has the Rx on file, but if not will contact our office for new Rx. He understands that if not better will go to ED.

## 2017-04-20 NOTE — Telephone Encounter (Signed)
Patient states that since Tuesday he has had n/v, has not taken anti-nausea medication. He does have 1-2 tablets of the phenergan left, but has not taken it. Requested a refill of this medication. Zofran does not help and did not take the compazine suppository. He reports intermittent epigastric pain and that last night x 2 in his vomit describes it as looking darker with "15-20 round pepper seeds" . He has only had chicken broth and water. Denies any fever, no blood in stool, no dizziness or shortness of breath. Please advise.

## 2017-08-16 ENCOUNTER — Emergency Department: Payer: Commercial Managed Care - HMO

## 2017-08-16 ENCOUNTER — Emergency Department
Admission: EM | Admit: 2017-08-16 | Discharge: 2017-08-16 | Disposition: A | Discharge status: Home / Self Care | Payer: Commercial Managed Care - HMO | Attending: Emergency Medical Services | Admitting: Emergency Medical Services

## 2017-08-16 DIAGNOSIS — R1110 Vomiting, unspecified: Secondary | ICD-10-CM | POA: Insufficient documentation

## 2017-08-16 DIAGNOSIS — Z8719 Personal history of other diseases of the digestive system: Secondary | ICD-10-CM | POA: Insufficient documentation

## 2017-08-16 DIAGNOSIS — R1013 Epigastric pain: Secondary | ICD-10-CM | POA: Insufficient documentation

## 2017-08-16 HISTORY — DX: Gastritis, unspecified, without bleeding: K29.70

## 2017-08-16 LAB — COMPREHENSIVE METABOLIC PANEL
ALT: 22 U/L (ref 0–55)
AST (SGOT): 26 U/L (ref 5–34)
Albumin/Globulin Ratio: 1.8 (ref 0.9–2.2)
Albumin: 5.5 g/dL — ABNORMAL HIGH (ref 3.5–5.0)
Alkaline Phosphatase: 58 U/L (ref 38–106)
Anion Gap: 14 (ref 5.0–15.0)
BUN: 14 mg/dL (ref 9.0–28.0)
Bilirubin, Total: 1 mg/dL (ref 0.2–1.2)
CO2: 28 mEq/L (ref 22–29)
Calcium: 10.7 mg/dL — ABNORMAL HIGH (ref 8.5–10.5)
Chloride: 100 mEq/L (ref 100–111)
Creatinine: 1.1 mg/dL (ref 0.7–1.3)
Globulin: 3.1 g/dL (ref 2.0–3.6)
Glucose: 99 mg/dL (ref 70–100)
Potassium: 3.6 mEq/L (ref 3.5–5.1)
Protein, Total: 8.6 g/dL — ABNORMAL HIGH (ref 6.0–8.3)
Sodium: 142 mEq/L (ref 136–145)

## 2017-08-16 LAB — CBC AND DIFFERENTIAL
Absolute NRBC: 0 10*3/uL (ref 0.00–0.00)
Basophils Absolute Automated: 0.03 10*3/uL (ref 0.00–0.08)
Basophils Automated: 0.2 %
Eosinophils Absolute Automated: 0 10*3/uL (ref 0.00–0.44)
Eosinophils Automated: 0 %
Hematocrit: 52.2 % — ABNORMAL HIGH (ref 37.6–49.6)
Hgb: 18.1 g/dL — ABNORMAL HIGH (ref 12.5–17.1)
Immature Granulocytes Absolute: 0.06 10*3/uL (ref 0.00–0.07)
Immature Granulocytes: 0.4 %
Lymphocytes Absolute Automated: 1.74 10*3/uL (ref 0.42–3.22)
Lymphocytes Automated: 11.2 %
MCH: 30.6 pg (ref 25.1–33.5)
MCHC: 34.7 g/dL (ref 31.5–35.8)
MCV: 88.3 fL (ref 78.0–96.0)
MPV: 9.1 fL (ref 8.9–12.5)
Monocytes Absolute Automated: 1.87 10*3/uL — ABNORMAL HIGH (ref 0.21–0.85)
Monocytes: 12 %
Neutrophils Absolute: 11.87 10*3/uL — ABNORMAL HIGH (ref 1.10–6.33)
Neutrophils: 76.2 %
Nucleated RBC: 0 /100 WBC (ref 0.0–0.0)
Platelets: 203 10*3/uL (ref 142–346)
RBC: 5.91 10*6/uL — ABNORMAL HIGH (ref 4.20–5.90)
RDW: 12 % (ref 11–15)
WBC: 15.57 10*3/uL — ABNORMAL HIGH (ref 3.10–9.50)

## 2017-08-16 LAB — URINALYSIS, REFLEX TO MICROSCOPIC EXAM IF INDICATED
Bilirubin, UA: NEGATIVE
Blood, UA: NEGATIVE
Glucose, UA: NEGATIVE
Ketones UA: 80
Leukocyte Esterase, UA: NEGATIVE
Nitrite, UA: NEGATIVE
Protein, UR: >=500 — AB
Specific Gravity UA: 1.035 (ref 1.001–1.035)
Urine pH: 6 (ref 5.0–8.0)
Urobilinogen, UA: NEGATIVE mg/dL

## 2017-08-16 LAB — GFR: EGFR: >60

## 2017-08-16 LAB — HEMOLYSIS INDEX: Hemolysis Index: 37 — ABNORMAL HIGH (ref 0–18)

## 2017-08-16 LAB — LIPASE: Lipase: 18 U/L (ref 8–78)

## 2017-08-16 MED ORDER — SUCRALFATE 1 G PO TABS
1.00 g | ORAL_TABLET | Freq: Once | ORAL | Status: AC
Start: 2017-08-16 — End: 2017-08-16
  Administered 2017-08-16: 16:00:00 1 g via ORAL
  Filled 2017-08-16: qty 1

## 2017-08-16 MED ORDER — DICYCLOMINE HCL 10 MG PO CAPS
10.00 mg | ORAL_CAPSULE | Freq: Once | ORAL | Status: AC
Start: 2017-08-16 — End: 2017-08-16
  Administered 2017-08-16: 16:00:00 10 mg via ORAL
  Filled 2017-08-16: qty 1

## 2017-08-16 MED ORDER — IOHEXOL 350 MG/ML IV SOLN
100.00 mL | Freq: Once | INTRAVENOUS | Status: AC | PRN
Start: 2017-08-16 — End: 2017-08-16
  Administered 2017-08-16: 17:00:00 100 mL via INTRAVENOUS

## 2017-08-16 MED ORDER — SODIUM CHLORIDE 0.9 % IV BOLUS
1000.00 mL | Freq: Once | INTRAVENOUS | Status: AC
Start: 2017-08-16 — End: 2017-08-16
  Administered 2017-08-16: 16:00:00 1000 mL via INTRAVENOUS

## 2017-08-16 MED ORDER — FAMOTIDINE 10 MG/ML IV SOLN (WRAP)
20.00 mg | Freq: Once | INTRAVENOUS | Status: AC
Start: 2017-08-16 — End: 2017-08-16
  Administered 2017-08-16: 16:00:00 20 mg via INTRAVENOUS
  Filled 2017-08-16: qty 2

## 2017-08-16 NOTE — ED Triage Notes (Signed)
Vomiting dark blood/coffee grounds in emesis since yesterday.  States he has gastris and has had this problem in the past.

## 2017-08-16 NOTE — Discharge Instructions (Signed)
Take over the counter Prilosec (omeprozole) or Nexium by mouth 30 min before meals. Stay away from fatty, spicy, acidic foods.

## 2017-08-16 NOTE — ED Provider Notes (Signed)
EMERGENCY DEPARTMENT HISTORY AND PHYSICAL EXAM     Physician/Midlevel provider first contact with patient: 08/16/17 1522         Date: 08/16/2017  Patient Name: Caleb Sanchez    History of Presenting Illness     Chief Complaint   Patient presents with   . Hematemesis       History Provided By: Patient and Patient's Mother    Chief Complaint: Vomiting  Duration Yesterday  Timing: Intermittent ep (multiple)  Location: NA  Quality: "Dark brown-reddish"  Severity: Moderate  Exacerbating factors: None  Alleviating factors: Zofran with intermittent relief  Associated Symptoms: Nausea and burning epigastric abd pain  Pertinent Negatives: Dysuria, urgency, frequency, or CP    Additional History: Caleb Sanchez is a 27 y.o. male with hx of gastritis and cyclic vomiting (since 2018) presenting to the ED with intermittent eps of "dark brown-reddish" vomiting onset yesterday with associated nausea and burning epigastric abd pain. Pt reports that Caleb Sanchez began vomiting a couple hours after eating dinner last night and the color was much darker than usual. Pt suspected it was blood and came into the ED for further eval. Pt had an endoscopy in 2018 for his repeat eps of vomiting which [place] . Caleb Sanchez has also had multiple CT's in the past. Denies drinking alcohol, dysuria, urgency, frequency, or CP.        PCP: Pcp, None, MD  SPECIALISTS:    No current facility-administered medications for this encounter.      No current outpatient prescriptions on file.       Past History     Past Medical History:  Past Medical History:   Diagnosis Date   . Gastritis        Past Surgical History:  Past Surgical History:   Procedure Laterality Date   . FOOT SURGERY     . HERNIA REPAIR     . NASAL SEPTUM SURGERY         Family History:  No family history on file.    Social History:  Social History   Substance Use Topics   . Smoking status: Never Smoker   . Smokeless tobacco: Never Used   . Alcohol use No        Allergies:  Allergies   Allergen Reactions   . Penicillins        Review of Systems     Review of Systems   Cardiovascular: Negative for chest pain.   Gastrointestinal: Positive for abdominal pain, nausea and vomiting.   Genitourinary: Negative for dysuria, frequency and urgency.   All other systems reviewed and are negative.    Physical Exam   BP 118/61   Pulse 76   Temp 98.5 F (36.9 C) (Oral)   Resp 16   Ht 5\' 11"  (1.803 m)   Wt 77.1 kg   SpO2 98%   BMI 23.71 kg/m     Nursing note and vitals reviewed.  Constitutional:  Well developed, well nourished. Awake & Oriented x3.  Head:  Atraumatic. Normocephalic.    Eyes:  PERRL. EOMI. Conjunctivae are not pale.  ENT:  Mucous membranes are moist and intact.  Patent airway.  Neck:  Supple. Full ROM.    Cardiovascular:  Regular rate. Regular rhythm. No murmurs, rubs, or gallops.  Respiratory:  No evidence of respiratory distress. Clear to auscultation bilaterally.  No wheezing, rales or rhonchi.   GI:  Soft and non-distended. Mild epigastric tenderness. No rebound, guarding, or  rigidity.  Back:  Full ROM. Nontender.  MSK:  No edema. No cyanosis. No clubbing. Full range of motion in all extremities.  Skin:  Skin is warm and dry.  No diaphoresis. No rash.   Neurological:  Alert, awake, and appropriate. Normal speech. Motor normal.  Psychiatric:  Good eye contact. Normal interaction, affect, and behavior.      Diagnostic Study Results     Labs -     Results     Procedure Component Value Units Date/Time    UA, Reflex to Microscopic (pts 3 + yrs) [[id number]]  (Abnormal) Collected:  08/16/17 1536    Specimen:  Urine Updated:  08/16/17 1619     Urine Type Clean Catch     Color, UA Amber (A)     Clarity, UA Hazy     Specific Gravity UA 1.035     Urine pH 6.0     Leukocyte Esterase, UA Negative     Nitrite, UA Negative     Protein, UR >=500 (A)     Glucose, UA Negative     Ketones UA 80     Urobilinogen, UA Negative mg/dL      Bilirubin, UA Negative     Blood, UA  Negative     RBC, UA 3 - 5 /hpf      WBC, UA 0 - 5 /hpf      Urine Mucus Present    Urine Culture [[id number]] Collected:  08/16/17 1534    Specimen:  Urine from Urine, Clean Catch Updated:  08/16/17 1618    Narrative:       Replace urinary catheter prior to obtaining the urine culture  if it has been in place for greater than or equal to 14  days:->No  Indications for Urine Culture:->N/A Pediatric Patient    Comprehensive Metabolic Panel (CMP) [[id number]]  (Abnormal) Collected:  08/16/17 1535    Specimen:  Blood Updated:  08/16/17 1606     Glucose 99 mg/dL      BUN 08.6 mg/dL      Creatinine 1.1 mg/dL      Sodium 578 mEq/L      Potassium 3.6 mEq/L      Chloride 100 mEq/L      CO2 28 mEq/L      Calcium 10.7 (H) mg/dL      Protein, Total 8.6 (H) g/dL      Albumin 5.5 (H) g/dL      AST (SGOT) 26 U/L      ALT 22 U/L      Alkaline Phosphatase 58 U/L      Bilirubin, Total 1.0 mg/dL      Globulin 3.1 g/dL      Albumin/Globulin Ratio 1.8     Anion Gap 14.0    Lipase [[id number]] Collected:  08/16/17 1535    Specimen:  Blood Updated:  08/16/17 1606     Lipase 18 U/L     Hemolysis index [[id number]]  (Abnormal) Collected:  08/16/17 1535     Updated:  08/16/17 1606     Hemolysis Index 37 (H)    GFR [[id number]] Collected:  08/16/17 1535     Updated:  08/16/17 1606     EGFR >60.0    CBC with differential [[id number]]  (Abnormal) Collected:  08/16/17 1535    Specimen:  Blood from Blood Updated:  08/16/17 1558     WBC 15.57 (H) x10 3/uL      Hgb 18.1 (H) g/dL  Hematocrit 52.2 (H) %      Platelets 203 x10 3/uL      RBC 5.91 (H) x10 6/uL      MCV 88.3 fL      MCH 30.6 pg      MCHC 34.7 g/dL      RDW 12 %      MPV 9.1 fL      Neutrophils 76.2 %      Lymphocytes Automated 11.2 %      Monocytes 12.0 %      Eosinophils Automated 0.0 %      Basophils Automated 0.2 %      Immature Granulocyte 0.4 %      Nucleated RBC 0.0 /100 WBC      Neutrophils Absolute 11.87 (H) x10 3/uL      Abs Lymph Automated 1.74 x10 3/uL      Abs Mono Automated  1.87 (H) x10 3/uL      Abs Eos Automated 0.00 x10 3/uL      Absolute Baso Automated 0.03 x10 3/uL      Absolute Immature Granulocyte 0.06 x10 3/uL      Absolute NRBC 0.00 x10 3/uL           Radiologic Studies -   Radiology Results (24 Hour)     Procedure Component Value Units Date/Time    CT Abdomen Pelvis W IV/ WO PO Cont [[id number]] Collected:  08/16/17 1652    Order Status:  Completed Updated:  08/16/17 1705    Narrative:       CT ABDOMEN PELVIS W IV/ WO PO CONT    CLINICAL INDICATION: Abdominal pain. Evaluate for SBO.    COMPARISON: None available.    TECHNIQUE: Helical CT scan through the abdomen and pelvis was obtained  from the dome of the diaphragm to the symphysis pubis after the  uneventful administration of 100 cc of nonionic intravenous Omnipaque  350 and no oral contrast.   Note that CT scanning at this site  utilizes multiple dose reduction  techniques including automatic exposure control, adjustment of the MAS  and/or KVP according to patient's size and use of iterative  reconstruction technique    FINDINGS: Mild diffuse fat infiltration of the liver noted. The      spleen, pancreas adrenal glands  are within normal limits.     The gallbladder is normal and there is no biliary dilatation.     The kidneys are within normal limits.        There is no bowel obstruction or abnormal bowel dilatation. The  appendix is normal.    There is no ascites or abnormal fluid collection.      The abdominal aorta and common iliac arteries Are within normal limits  for age. No retroperitoneal mass or adenopathy is present. The lumbar  spine is within normal limits for age.      The urinary bladder is within normal limits. The  process seminal  vesicles within normal limits            Impression:        No acute intraperitoneal process. Normal appendix.    Heron Nay, MD   08/16/2017 5:01 PM      .      ED Medication Orders     Start Ordered     Status Ordering Provider    08/16/17 1541 08/16/17 1540  sodium chloride  0.9 % bolus 1,000 mL  Once     Route:  Intravenous  Ordered Dose: 1,000 mL     Last MAR action:  Stopped Chana Lindstrom SHU    08/16/17 1541 08/16/17 1540  famotidine (PEPCID) injection 20 mg  Once     Route: Intravenous  Ordered Dose: 20 mg     Last MAR action:  Given Luiz Trumpower SHU    08/16/17 1541 08/16/17 1540  sucralfate (CARAFATE) tablet 1 g  Once     Route: Oral  Ordered Dose: 1 g     Last MAR action:  Given Eulla Kochanowski [facility]U    08/16/17 1541 08/16/17 1540  dicyclomine (BENTYL) capsule 10 mg  Once     Route: Oral  Ordered Dose: 10 mg     Last MAR action:  Given Lester Platas [facility]U            Medical Decision Making   I am the first provider for this patient.    I reviewed the vital signs, available nursing notes, past medical history, past surgical history, family history and social history.    Vital Signs-Reviewed the patient's vital signs.     Patient Vitals for the past 12 hrs:   BP Temp Pulse Resp   08/16/17 1729 118/61 98.5 F (36.9 C) 76 16   08/16/17 1430 130/69 98.6 F (37 C) (!) 59 16       Pulse Oximetry Analysis - Normal 98% on RA    Old Medical Records: Not in epic.     ED Course:   3:30 PM - Assessed pt condition and discussed ED plan for meds, blood work, and other orders. Pt agrees with plan.    5:27 PM - Re-evaluated pt who is resting comfortably in bed. Updated pt and pt's mother on all results and plan to f/u with a GI. They agree with plan for [facility] and f/u. Pt reports Chidi Shirer was successfully able to drink water without vomiting at home PTA.    5:44 PM - Discussed results, discharge Rx, discharge instructions, follow up instructions, and return precautions with the patient.  Solicited and answered all questions.  The patient agrees with the care plan and is amenable to discharge.      Provider Notes:   R/o sbo, r/o colitis, r/o anemia, r/o aki, ? Genella Rife, outpatient f/u    Diagnosis     Clinical Impression:   1. Vomiting, intractability of vomiting not specified, presence of nausea not specified, unspecified  vomiting type    2. Epigastric pain        Treatment Plan:   ED Disposition     ED Disposition Condition Date/Time Comment    Discharge  Thu Aug 16, 2017  5:42 PM Petra Kuba discharge to home/self care.    Condition at disposition: Stable            _______________________________      Prescriptions:  There are no discharge medications for this patient.              Attestations: This note is prepared by Manya Silvas, acting as scribe for Mathis Fare Azaria Bartell, MD.    Mathis Fare Duvall Comes, MD - The scribe's documentation has been prepared under my direction and personally reviewed by me in its entirety.  I confirm that the note above accurately reflects all work, treatment, procedures, and medical decision making performed by me.    _______________________________     Atharva Mirsky, Zadie Rhine, MD PhD  08/18/17 0040

## 2017-08-29 ENCOUNTER — Encounter: Payer: Self-pay | Admitting: Medical

## 2017-08-29 ENCOUNTER — Other Ambulatory Visit (HOSPITAL_COMMUNITY)
Admission: RE | Admit: 2017-08-29 | Discharge: 2017-08-29 | Disposition: A | Discharge status: Home / Self Care | Payer: Managed Care, Other (non HMO) | Source: Ambulatory Visit | Attending: Medical | Admitting: Medical

## 2017-08-29 ENCOUNTER — Ambulatory Visit: Payer: Managed Care, Other (non HMO) | Admitting: Medical

## 2017-08-29 ENCOUNTER — Ambulatory Visit: Payer: BLUE CROSS/BLUE SHIELD | Admitting: Family Medicine

## 2017-08-29 VITALS — BP 120/74 | HR 72 | Temp 97.8°F | Resp 16 | Ht 71.0 in | Wt 171.2 lb

## 2017-08-29 DIAGNOSIS — Z113 Encounter for screening for infections with a predominantly sexual mode of transmission: Secondary | ICD-10-CM | POA: Insufficient documentation

## 2017-08-29 DIAGNOSIS — L309 Dermatitis, unspecified: Secondary | ICD-10-CM

## 2017-08-29 DIAGNOSIS — J342 Deviated nasal septum: Secondary | ICD-10-CM | POA: Diagnosis not present

## 2017-08-29 MED ORDER — TRIAMCINOLONE ACETONIDE 0.1 % EX CREA: 1.0000 "application " | TOPICAL_CREAM | Freq: Two times a day (BID) | CUTANEOUS | 1 refills | Status: AC

## 2017-08-29 NOTE — Progress Notes (Signed)
Subjective:    Patient ID: Dillon Martin, male    DOB: 04/02/1991, 27 y.o.   MRN: [id number]  HPI   Pt here for std testing. Pt has concern for possible std. Pt states no new partner. In long distance relationship and has  sex with that person about one time every  2-4 months.   No dc from penis. No rash on head of penis. Maybe slight occasional pain when he urinates.  (He noted septal complaint as getting up to leave the room) Hx of septal deviation surgery. 4-6 yrs ago. Got surgery years ago. He still feels like nose blocked/left side. He request referral.  Review of Systems  Constitutional: Negative for chills and fever.  Respiratory: Negative for cough, chest tightness, shortness of breath and wheezing.   Cardiovascular: Negative for chest pain and palpitations.  Gastrointestinal: Negative for abdominal pain and anal bleeding.       Note no report of any acute GI symptoms presently but he does update me on history of his symptoms and prior work-up by local GI.  See AVS my recommendations.  Musculoskeletal: Negative for back pain.  Skin: Negative for rash.       End noted hand rash and dryness when washes hands excessivley at work. rx kenalog given today.  Hematological: Negative for adenopathy. Does not bruise/bleed easily.  Psychiatric/Behavioral: Negative for behavioral problems and confusion.    Past Medical History:  Diagnosis Date  . Chicken pox   . Childhood asthma   . GERD (gastroesophageal reflux disease)   . Seasonal allergies      Social History   Socioeconomic History  . Marital status: Single    Spouse name: Not on file  . Number of children: Not on file  . Years of education: Not on file  . Highest education level: Not on file  Occupational History  . Occupation: Chef  Social Needs  . Financial resource strain: Not on file  . Food insecurity:    Worry: Not on file    Inability: Not on file  . Transportation needs:    Medical: Not on file   Non-medical: Not on file  Tobacco Use  . Smoking status: Never Smoker  . Smokeless tobacco: Never Used  Substance and Sexual Activity  . Alcohol use: No    Alcohol/week: 0.0 oz  . Drug use: No  . Sexual activity: Not on file  Lifestyle  . Physical activity:    Days per week: Not on file    Minutes per session: Not on file  . Stress: Not on file  Relationships  . Social connections:    Talks on phone: Not on file    Gets together: Not on file    Attends religious service: Not on file    Active member of club or organization: Not on file    Attends meetings of clubs or organizations: Not on file    Relationship status: Not on file  . Intimate partner violence:    Fear of current or ex partner: Not on file    Emotionally abused: Not on file    Physically abused: Not on file    Forced sexual activity: Not on file  Other Topics Concern  . Not on file  Social History Narrative  . Not on file    Past Surgical History:  Procedure Laterality Date  . ADENOIDECTOMY  2000  . ANKLE SURGERY Bilateral 2006   Pins in ankles to correct flat feet  .  INGUINAL HERNIA REPAIR  1998  . NASAL SEPTUM SURGERY  2013  . WISDOM TOOTH EXTRACTION  2012    Family History  Problem Relation Age of Onset  . Hypertension Mother   . Heart disease Maternal Grandfather   . Hypertension Maternal Grandfather   . Diabetes Maternal Grandfather   . Colon cancer Neg Hx   . Esophageal cancer Neg Hx     Allergies  Allergen Reactions  . Amoxicillin     Current Outpatient Medications on File Prior to Visit  Medication Sig Dispense Refill  . cetirizine (ZYRTEC) 10 MG tablet Take 10 mg as needed by mouth for allergies.    Marland Kitchen lidocaine (XYLOCAINE) 2 % solution Use as directed 15 mLs in the mouth or throat every 4 (four) hours as needed for mouth pain. 100 mL 0  . metoCLOPramide (REGLAN) 5 MG tablet Take 1 tablet (5 mg total) 3 (three) times daily before meals by mouth. 30 tablet 2  . omeprazole (PRILOSEC)  40 MG capsule Take 1 capsule (40 mg total) by mouth 2 (two) times daily before a meal. 60 capsule 1  . ondansetron (ZOFRAN ODT) 4 MG disintegrating tablet Take 1 tablet (4 mg total) by mouth every 8 (eight) hours as needed for nausea or vomiting. 90 tablet 1  . prochlorperazine (COMPAZINE) 25 MG suppository Place 1 suppository (25 mg total) every 12 (twelve) hours as needed rectally for nausea or vomiting (Use if unable to take other nausea medication by mouth). 6 suppository 1  . promethazine (PHENERGAN) 25 MG tablet Take 1 tablet (25 mg total) by mouth every 6 (six) hours as needed for nausea or vomiting. 12 tablet 0   No current facility-administered medications on file prior to visit.     BP 120/74   Pulse 72   Temp 97.8 F (36.6 C) (Oral)   Resp 16   Ht  (1.803 m)   Wt 171 lb 3.2 oz (77.7 kg)   SpO2 100%   BMI 23.88 kg/m       Objective:   Physical Exam  General Mental Status- Alert. General Appearance- Not in acute distress.   Skin General: Color- Normal Color. Moisture- Normal Moisture.   HEENT- septum deviated (to the left).   Chest and Lung Exam Auscultation: Breath Sounds:-Normal.  Cardiovascular Auscultation:Rythm- Regular. Murmurs & Other Heart Sounds:Auscultation of the heart reveals- No Murmurs.  Neurologic Cranial Nerve exam:- CN III-XII intact(No nystagmus), symmetric smile. Strength:- 5/5 equal and symmetric strength both upper and lower extremities.  Genital- no genital lesion, no dc.  2 small appearing moles in suprapubic region.  No appearance of genital warts presently.      Assessment & Plan:  You do have concern for possible STD.  No severe symptoms on review.  The physical exam appears negative presently as well.  We are going to uterine urine ancillary studies today to include gonorrhea, chlamydia and trichomonas.  Also HIV and RPR lab work.  Consider prescribing doxycycline today but your symptoms are not back suspicious and you also  have history of severe GI sensitivity.  So we will wait for test results to come back.  Prescribe antibiotic if necessary.  If you do end of having to be on doxycycline for positive test results and then make sure that you have food in your stomach before taking the tablets.  Reminder to practice of safe sex.  Follow-up date to be determined after test review.  Also for your prolonged GI history, I do  recommend that you call your local GI office and asked to speak to Dr. Myrtie Neither nurse.  You can see if they would recommend other medication options that you are comfortable with.  Also you might inquire about studies to evaluate your gallbladder function or gastric emptying time as you report MD in [place] recommended this.  Also would recommend asking them when you were supposed to follow-up as I did not see specific date on your procedure note.  Esperanza Richters, PA-C

## 2017-08-29 NOTE — Patient Instructions (Addendum)
You do have concern for possible STD.  No severe symptoms on review.  The physical exam appears negative presently as well.  We are going to uterine urine ancillary studies today to include gonorrhea, chlamydia and trichomonas.  Also HIV and RPR lab work.  Consider prescribing doxycycline today but your symptoms are not back suspicious and you also have history of severe GI sensitivity.  So we will wait for test results to come back.  Prescribe antibiotic if necessary.  If you do end of having to be on doxycycline for positive test results and then make sure that you have food in your stomach before taking the tablets.  Reminder to practice of safe sex.  Follow-up date to be determined after test review.  Also for your prolonged GI history, I do recommend that you call your local GI office and asked to speak to Dr. Myrtie Neither nurse.  You can see if they would recommend other medication options that you are comfortable with.  Also you might inquire about studies to evaluate your gallbladder function or gastric emptying time as you report MD in [place] recommended this.  Also would recommend asking them when you were supposed to follow-up as I did not see specific date on your procedure note.

## 2017-08-30 LAB — RPR: RPR Ser Ql: NONREACTIVE

## 2017-08-30 LAB — HIV ANTIBODY (ROUTINE TESTING W REFLEX): HIV: NONREACTIVE

## 2017-08-31 LAB — URINE CYTOLOGY ANCILLARY ONLY
CHLAMYDIA, DNA PROBE: NEGATIVE
Neisseria Gonorrhea: NEGATIVE
Trichomonas: NEGATIVE

## 2018-02-05 ENCOUNTER — Other Ambulatory Visit: Payer: Self-pay | Admitting: Medical

## 2018-02-05 DIAGNOSIS — R112 Nausea with vomiting, unspecified: Secondary | ICD-10-CM

## 2018-02-12 ENCOUNTER — Ambulatory Visit
Admission: RE | Admit: 2018-02-12 | Discharge: 2018-02-12 | Disposition: A | Discharge status: Home / Self Care | Payer: Commercial Managed Care - HMO | Source: Ambulatory Visit | Attending: Medical | Admitting: Medical

## 2018-02-12 DIAGNOSIS — R112 Nausea with vomiting, unspecified: Secondary | ICD-10-CM | POA: Insufficient documentation

## 2018-02-12 MED ORDER — TECHNETIUM TC 99M SULFUR COLLOID
1.00 | Freq: Once | Status: AC | PRN
Start: 2018-02-12 — End: 2018-02-12
  Administered 2018-02-12: 1 via ORAL

## 2018-05-21 ENCOUNTER — Other Ambulatory Visit: Payer: Self-pay | Admitting: Physician Assistant

## 2018-05-21 DIAGNOSIS — R112 Nausea with vomiting, unspecified: Secondary | ICD-10-CM

## 2018-05-23 ENCOUNTER — Emergency Department
Admission: EM | Admit: 2018-05-23 | Discharge: 2018-05-23 | Discharge status: Against Medical Advice | Payer: Commercial Managed Care - HMO | Attending: Internal Medicine | Admitting: Internal Medicine

## 2018-05-23 ENCOUNTER — Emergency Department: Payer: Commercial Managed Care - HMO

## 2018-05-23 DIAGNOSIS — D72829 Elevated white blood cell count, unspecified: Secondary | ICD-10-CM | POA: Insufficient documentation

## 2018-05-23 DIAGNOSIS — K219 Gastro-esophageal reflux disease without esophagitis: Secondary | ICD-10-CM | POA: Insufficient documentation

## 2018-05-23 DIAGNOSIS — K920 Hematemesis: Secondary | ICD-10-CM | POA: Insufficient documentation

## 2018-05-23 DIAGNOSIS — R112 Nausea with vomiting, unspecified: Secondary | ICD-10-CM

## 2018-05-23 LAB — CBC AND DIFFERENTIAL
Absolute NRBC: 0 10*3/uL (ref 0.00–0.00)
Basophils Absolute Automated: 0.04 10*3/uL (ref 0.00–0.08)
Basophils Automated: 0.2 %
Eosinophils Absolute Automated: 0 10*3/uL (ref 0.00–0.44)
Eosinophils Automated: 0 %
Hematocrit: 52 % — ABNORMAL HIGH (ref 37.6–49.6)
Hgb: 18.2 g/dL — ABNORMAL HIGH (ref 12.5–17.1)
Immature Granulocytes Absolute: 0.09 10*3/uL — ABNORMAL HIGH (ref 0.00–0.07)
Immature Granulocytes: 0.5 %
Lymphocytes Absolute Automated: 1.02 10*3/uL (ref 0.42–3.22)
Lymphocytes Automated: 5.4 %
MCH: 30.9 pg (ref 25.1–33.5)
MCHC: 35 g/dL (ref 31.5–35.8)
MCV: 88.3 fL (ref 78.0–96.0)
MPV: 9.8 fL (ref 8.9–12.5)
Monocytes Absolute Automated: 1.18 10*3/uL — ABNORMAL HIGH (ref 0.21–0.85)
Monocytes: 6.2 %
Neutrophils Absolute: 16.69 10*3/uL — ABNORMAL HIGH (ref 1.10–6.33)
Neutrophils: 87.7 %
Nucleated RBC: 0 /100 WBC (ref 0.0–0.0)
Platelets: 195 10*3/uL (ref 142–346)
RBC: 5.89 10*6/uL (ref 4.20–5.90)
RDW: 12 % (ref 11–15)
WBC: 19.02 10*3/uL — ABNORMAL HIGH (ref 3.10–9.50)

## 2018-05-23 LAB — COMPREHENSIVE METABOLIC PANEL
ALT: 18 U/L (ref 0–55)
AST (SGOT): 23 U/L (ref 5–34)
Albumin/Globulin Ratio: 2 (ref 0.9–2.2)
Albumin: 5.4 g/dL — ABNORMAL HIGH (ref 3.5–5.0)
Alkaline Phosphatase: 52 U/L (ref 38–106)
Anion Gap: 14 (ref 5.0–15.0)
BUN: 18 mg/dL (ref 9.0–28.0)
Bilirubin, Total: 1.1 mg/dL (ref 0.2–1.2)
CO2: 25 mEq/L (ref 22–29)
Calcium: 10.6 mg/dL — ABNORMAL HIGH (ref 8.5–10.5)
Chloride: 102 mEq/L (ref 100–111)
Creatinine: 1.1 mg/dL (ref 0.7–1.3)
Globulin: 2.7 g/dL (ref 2.0–3.6)
Glucose: 111 mg/dL — ABNORMAL HIGH (ref 70–100)
Potassium: 3.7 mEq/L (ref 3.5–5.1)
Protein, Total: 8.1 g/dL (ref 6.0–8.3)
Sodium: 141 mEq/L (ref 136–145)

## 2018-05-23 LAB — HEMOLYSIS INDEX: Hemolysis Index: 14 (ref 0–18)

## 2018-05-23 LAB — LIPASE: Lipase: 12 U/L (ref 8–78)

## 2018-05-23 LAB — GFR: EGFR: >60

## 2018-05-23 MED ORDER — IOHEXOL 350 MG/ML IV SOLN
100.00 mL | Freq: Once | INTRAVENOUS | Status: AC | PRN
Start: 2018-05-23 — End: 2018-05-23
  Administered 2018-05-23: 100 mL via INTRAVENOUS

## 2018-05-23 MED ORDER — FAMOTIDINE 10 MG/ML IV SOLN (WRAP)
20.00 mg | Freq: Once | INTRAVENOUS | Status: AC
Start: 2018-05-23 — End: 2018-05-23
  Administered 2018-05-23: 18:00:00 20 mg via INTRAVENOUS
  Filled 2018-05-23: qty 2

## 2018-05-23 MED ORDER — SODIUM CHLORIDE 0.9 % IV BOLUS
1000.00 mL | Freq: Once | INTRAVENOUS | Status: AC
Start: 2018-05-23 — End: 2018-05-23
  Administered 2018-05-23: 18:00:00 1000 mL via INTRAVENOUS

## 2018-05-23 MED ORDER — ONDANSETRON HCL 4 MG/2ML IJ SOLN
4.00 mg | Freq: Once | INTRAMUSCULAR | Status: AC
Start: 2018-05-23 — End: 2018-05-23
  Administered 2018-05-23: 18:00:00 4 mg via INTRAVENOUS
  Filled 2018-05-23: qty 2

## 2018-05-23 NOTE — ED Triage Notes (Signed)
Patient presents  to the ED with c/o vomiting coffee ground emesis yesterday, today vomited bright red, burning feeling in the abdomen into the chest.

## 2018-05-23 NOTE — Discharge Instructions (Signed)
Hematemesis (NOS)     You were seen for hematemesis     Hematemesis is the medical term for vomiting (throwing up) blood. This blood often comes from your stomach or esophagus. The esophagus is the tube in your body that brings food to your stomach. When you throw up blood, the color might be bright, dark red. It might also be black and look like coffee grounds. Bright red blood means that the blood is fresh. Darker blood is often blood that is older and that has been sitting. This suggests that the bleeding was not recent.     Hematemesis can have many different causes. One of the most common is an ulcer, which is a tear in the lining of your stomach. An ulcer can cause blood to ooze into your stomach, which you then throw up. Hematemesis also happens in people who drink a lot of alcohol. This is because alcohol irritates the lining of the stomach, which then causes small amounts of bleeding. This is called gastritis. Hematemesis also happens in people with livers that do not work very well because of cirrhosis. Cirrhosis is a scarring of the liver. The bleeding happens because of high pressure in the blood vessels of the esophagus. Finally, bleeding can start after people have already been throwing up. This is due to small tears that get created in the surface of the esophagus, called Mallory-Weiss tears.     Sometimes the only symptom of hematemesis is vomiting of blood. At other times, there might also be belly pain or bloody or black stool (poop). When you are losing a lot of blood, there might also be changes to your vital signs. These changes might be low blood pressure or a high heart rate. Lab tests often show low levels of hemoglobin. This means that you have fewer red blood cells in your body than normal.     To find the cause of your hematemesis, you might undergo a procedure called esophago-gastro-duodenoscopy (EGD for short). During an EGD, a tube is inserted into your duodenum (small intestine)  through your mouth, esophagus, and stomach. The tube has a camera attached to it. With the camera, your doctor can see the your duodenum and find where you are bleeding. A machine can then cauterize (burn) your blood vessels to stop the bleeding. Sometimes, the bleeding has already stopped by the time the EGD happens.     Don’t drink alcohol or take any over-the-counter anti-inflammatory medicines like ibuprofen, naproxen, or aspirin. Unless you have liver disease, it is okay to take acetaminophen (Tylenol®) for pain.     Follow up with your primary doctor or GI specialist doctor.     We don’t believe your condition is serious right now, but it is important to be careful. Sometimes a problem that seems small can get serious later. This is why it is very important to come back here or go to the nearest Emergency Department if you don’t get better or if your symptoms get worse.     YOU SHOULD SEEK MEDICAL ATTENTION IMMEDIATELY, EITHER HERE OR AT THE NEAREST EMERGENCY DEPARTMENT, IF ANY OF THE FOLLOWING OCCUR:     · You keep on vomiting (throwing up) blood.  · You have bright red, dark red, or black stool (poop).  · You have bad abdominal (belly) pain.  · You feel lightheaded, dizzy, or weak overall.     If you can't follow up with your doctor, or if at any time you feel you need to be rechecked or seen again, come back here or   go to the nearest emergency department.               Nausea    You have been seen for nausea.    Nausea is the feeling that you are going to vomit (throw up). Nausea is not a disease. It is a symptom of another problem. For example, nausea, vomiting and diarrhea are symptoms of a stomach virus (the "stomach flu").    You may or may not vomit when you have nausea.     The nausea itself is not dangerous but it can be very uncomfortable.    We might not be able to find out today what is causing your nausea. It is VERY IMPORTANT to see your doctor who can watch for any serious  problems.    There are many treatments for nausea. The medical staff will discuss these with you. Some common medicines used to help with nausea are   Promethazine (Phenergan), prochlorperazine (Compazine), metoclopramide (Reglan), ondansetron (Zofran), and many others.    Follow a clear liquid diet. Drink water, broth, 7-Up, Sprite, or other clear caffeine-free soft drinks or sports drinks until you feel better. This might help the nausea and keep you from vomiting.     If your nausea lasts for longer than a few days, or if you have new symptoms, we STRONGLY RECOMMEND that you go to see your family doctor, specialist or clinic. If you cannot get an appointment or do not have a doctor you can always return here or go to the nearest emergency department to be seen again.    YOU SHOULD SEEK MEDICAL ATTENTION IMMEDIATELY, EITHER HERE OR AT THE NEAREST EMERGENCY DEPARTMENT, IF ANY OF THE FOLLOWING OCCURS:   You vomit often.   You have abdominal (belly) pain.   You vomit blood or anything that looks like coffee grounds.   You have a headache.   You have any new symptoms or concerns.   You feel more unwell.               Vomiting    You have been seen for vomiting.    Vomiting (throwing-up) can be caused by many different things. Most of the time the cause IS NOT serious. The doctor feels it is OK for you to go home today.    Common causes of vomiting include the following:   Gastroenteritis (stomach flu), usually with diarrhea.   Other illnesses. Sometimes medical conditions like diabetes, heart problems, headaches, or infections can make someone throw up.    Bowel obstructions (blockages) can cause vomiting and make patients unable to have bowel movements (stool) or pass gas.   Vomiting can be a symptom of appendicitis, especially if there is also pain in the right lower abdomen (belly).    Sometimes it is hard to find out what is causing the vomiting. Vomiting can be treated with anti-nausea  medicines like promethazine (Phenergan), prochlorperazine (Compazine) or ondansetron (Zofran).    Try to drink liquids to avoid dehydration. Dont drink a lot of fluid all at once. Take small sips throughout the day.    YOU SHOULD SEEK MEDICAL ATTENTION IMMEDIATELY, EITHER HERE OR AT THE NEAREST EMERGENCY DEPARTMENT, IF ANY OF THE FOLLOWING OCCURS:   You cant stop vomiting or your vomiting doesnt get better with medication.   You cannot keep liquids down.   You have severe sudden chest or belly pain after vomiting.   You have abdominal pain.

## 2018-05-23 NOTE — ED Notes (Signed)
Bed: BL22  Expected date:   Expected time:   Means of arrival:   Comments:  CN - dirty

## 2018-05-23 NOTE — ED Provider Notes (Addendum)
EMERGENCY DEPARTMENT HISTORY AND PHYSICAL EXAM     Physician/Midlevel provider first contact with patient: 05/23/18 1750         Date: 05/23/2018  Patient Name: Caleb Sanchez    History of Presenting Illness     Chief Complaint   Patient presents with    Emesis       History Provided By: pt    Chief Complaint: vomiting  Onset: yesterday  Timing: acute on chronic  Location: gi  Quality:  With dark and brb   Severity: moderate  Modifying Factors: none  Associated Symptoms:  palpitations    Additional History: Caleb Sanchez is a 28 y.o. male with a h/o cyclic vomiting. He began vomiting yesterday and initially it appeared to be coffee ground emesis but today there were some streaks of bright red blood.He denies headache, abdominal pain, constipation or diarrhea or melena or brbpr. He takes omeprazole daily and has zofran and phenergan at home but has not taken because they usually don't help. He denies any substance abuse. He has had an extensive work-up in [place] for his cyclic vomiting including an egd which showed some "irritation" in his stomach and small intestine.      PCP: Pcp, None, MD      No current facility-administered medications for this encounter.      Current Outpatient Medications   Medication Sig Dispense Refill    omeprazole (PRILOSEC) 40 MG capsule 1 CAPSULE ONCE A DAY 30 MINUTES BEFORE BREAKFAST ORALLY 30 DAY(S)  0       Past History     Past Medical History:  Past Medical History:   Diagnosis Date    Gastritis     Gastroesophageal reflux disease        Past Surgical History:  Past Surgical History:   Procedure Laterality Date    FOOT SURGERY      HERNIA REPAIR      NASAL SEPTUM SURGERY         Family History:  History reviewed. No pertinent family history.    Social History:  Social History     Tobacco Use    Smoking status: Never Smoker    Smokeless tobacco: Never Used   Substance Use Topics    Alcohol use: No    Drug use: Not on file       Allergies:  Allergies    Allergen Reactions    Penicillins        Review of Systems   Review of Systems   Constitutional: Negative for chills and fever.   HENT: Negative for congestion.    Eyes: Negative for photophobia and discharge.   Respiratory: Negative for cough, shortness of breath and stridor.    Cardiovascular: Positive for palpitations. Negative for chest pain.   Gastrointestinal: Positive for nausea and vomiting. Negative for abdominal pain, blood in stool, constipation, diarrhea and melena.   Genitourinary: Negative for dysuria and hematuria.   Musculoskeletal: Negative for back pain and neck pain.   Skin: Negative for itching and rash.   Neurological: Negative for dizziness, loss of consciousness and headaches.   Endo/Heme/Allergies: Negative for polydipsia. Does not bruise/bleed easily.   Psychiatric/Behavioral: Negative for hallucinations and substance abuse. The patient is not nervous/anxious.         Physical Exam   BP 136/70    Pulse 82    Temp 98.1 F (36.7 C) (Oral)    Resp 16    Ht 5\' 11"  (1.803 m)  Wt 79.4 kg    SpO2 97%    BMI 24.41 kg/m   Physical Exam   Constitutional: He is oriented to person, place, and time. He appears well-developed and well-nourished. No distress.   HENT:   Head: Normocephalic and atraumatic.   Eyes: Conjunctivae are normal. Right eye exhibits no discharge. Left eye exhibits no discharge. No scleral icterus.   Neck: Normal range of motion. Neck supple.   Cardiovascular: Normal rate, regular rhythm and normal heart sounds.   Pulmonary/Chest: Effort normal and breath sounds normal. No respiratory distress. He has no wheezes. He has no rales.   Abdominal: Soft. Bowel sounds are normal. He exhibits no distension. There is no abdominal tenderness. There is no rebound and no guarding.   Musculoskeletal:         General: No tenderness or edema.   Neurological: He is alert and oriented to person, place, and time.   Skin: Skin is warm and dry. He is not diaphoretic.   Psychiatric: He has a normal  mood and affect. His behavior is normal. Judgment and thought content normal.   Nursing note and vitals reviewed.        Diagnostic Study Results     Labs -     Results     Procedure Component Value Units Date/Time    Lipase [[id number]] Collected:  05/23/18 1636    Specimen:  Blood Updated:  05/23/18 1714     Lipase 12 U/L     Hemolysis index [[id number]] Collected:  05/23/18 1636     Updated:  05/23/18 1714     Hemolysis Index 14    GFR [[id number]] Collected:  05/23/18 1636     Updated:  05/23/18 1714     EGFR >60.0    Comprehensive metabolic panel [[id number]]  (Abnormal) Collected:  05/23/18 1636    Specimen:  Blood Updated:  05/23/18 1714     Glucose 111 mg/dL      BUN 44.0 mg/dL      Creatinine 1.1 mg/dL      Sodium 102 mEq/L      Potassium 3.7 mEq/L      Chloride 102 mEq/L      CO2 25 mEq/L      Calcium 10.6 mg/dL      Protein, Total 8.1 g/dL      Albumin 5.4 g/dL      AST (SGOT) 23 U/L      ALT 18 U/L      Alkaline Phosphatase 52 U/L      Bilirubin, Total 1.1 mg/dL      Globulin 2.7 g/dL      Albumin/Globulin Ratio 2.0     Anion Gap 14.0    CBC and differential [[id number]]  (Abnormal) Collected:  05/23/18 1636    Specimen:  Blood Updated:  05/23/18 1707     WBC 19.02 x10 3/uL      Hgb 18.2 g/dL      Hematocrit 34.7 %      Platelets 195 x10 3/uL      RBC 5.89 x10 6/uL      MCV 88.3 fL      MCH 30.9 pg      MCHC 35.0 g/dL      RDW 12 %      MPV 9.8 fL      Neutrophils 87.7 %      Lymphocytes Automated 5.4 %      Monocytes 6.2 %      Eosinophils Automated 0.0 %  Basophils Automated 0.2 %      Immature Granulocyte 0.5 %      Nucleated RBC 0.0 /100 WBC      Neutrophils Absolute 16.69 x10 3/uL      Abs Lymph Automated 1.02 x10 3/uL      Abs Mono Automated 1.18 x10 3/uL      Abs Eos Automated 0.00 x10 3/uL      Absolute Baso Automated 0.04 x10 3/uL      Absolute Immature Granulocyte 0.09 x10 3/uL      Absolute NRBC 0.00 x10 3/uL           Radiologic Studies -   Radiology Results (24 Hour)     Procedure Component  Value Units Date/Time    CT Abd/Pelvis with IV Contrast only [[id number]] Collected:  05/23/18 2008    Order Status:  Completed Updated:  05/23/18 2018    Narrative:       CLINICAL HISTORY:Nausea and vomiting    COMPARISON: 08/16/2017    TECHNIQUE: Multiple axial CT images through the abdomen and pelvis were  obtained following the administration of intravenous contrast. 100 cc of  Omnipaque was administered.  A combination of a automatic exposure  control, adjustment of the mA and/or kV  according to patient size  and/or use of iterative reconstruction technique was utilized.    FINDINGS:       Lung bases: No basilar pleural or pericardial effusion. Lung bases are  clear.    Liver: Liver is mildly enlarged measures 18.5 cm in length. The liver is  normal in attenuation. Few new small hypodensities in liver measuring up  to 4 to 5 mm.    Gallbladder: Normal    Spleen: Normal    Adrenal glands: Normal    Pancreas: Normal    Kidneys: Kidneys enhance symmetrically. No focal renal abnormality or  hydronephrosis.    Bladder: Normal    Prostate gland vesicles: Unremarkable    Bowel: There is no evidence of bowel obstruction or bowel dilatation.  Appendix is normal.    Great vessels: No abdominal aortic aneurysm. Major mesenteric  vasculature is patent.    Lymph nodes: No abdominal or pelvic lymphadenopathy.    Peritoneum: No free fluid or free air.    Bones: No destructive osseous lesions.      Impression:            1. No acute abnormality in the abdomen or pelvis.  2. Few new subcentimeter hypodensities in the liver. Further evaluation  with ultrasound or MRI could be performed.    Wyatt Portela, MD   05/23/2018 8:14 PM      .      Medical Decision Making   I am the first provider for this patient.    Vital Signs-Reviewed the patient's vital signs.       EKG:  Interpreted by the EP.   Time Interpreted: 16:04   Rate: 106   Rhythm: Sinus Tachycardia    Interpretation:irbbb       ED Course:    .edcourse    9:43 PM - Discussed  pt case with Dr. Ortencia Kick, GI MD who recommended admission given high wbc. Ct.    9:50 PM - Pt declines admission is leaving AMA. Despite repeated attempts to convince this patient (and any available family members), of the need for hospitalization, I have been unable to do so.  The patient is alert, oriented and capable of making decisions for him/her self.  I  have advised the patient that he/she should return immediately if worse.             Diagnosis     Clinical Impression:   1. Nausea and vomiting, intractability of vomiting not specified, unspecified vomiting type    2. Hematemesis with nausea    3. Leukocytosis, unspecified type        _______________________________    Attestations:  This note is prepared by Avanell Shackleton, MD.     Avanell Shackleton, MD.  I confirm that the note above accurately reflects all work, treatment, procedures, and medical decision making performed by me.    _______________________________       Azzie Glatter, MD  05/26/18 1633       Azzie Glatter, MD  05/26/18 [phone]

## 2018-05-24 LAB — ECG 12-LEAD
Atrial Rate: 106 {beats}/min
P Axis: 85 degrees
P-R Interval: 132 ms
Q-T Interval: 342 ms
QRS Duration: 102 ms
QTC Calculation (Bezet): 454 ms
R Axis: 84 degrees
T Axis: 68 degrees
Ventricular Rate: 106 {beats}/min

## 2018-05-27 ENCOUNTER — Ambulatory Visit
Admission: RE | Admit: 2018-05-27 | Discharge: 2018-05-27 | Disposition: A | Discharge status: Home / Self Care | Payer: Commercial Managed Care - HMO | Source: Ambulatory Visit | Attending: Physician Assistant | Admitting: Physician Assistant

## 2018-06-04 ENCOUNTER — Ambulatory Visit
Admission: RE | Admit: 2018-06-04 | Discharge: 2018-06-04 | Disposition: A | Discharge status: Home / Self Care | Payer: Commercial Managed Care - HMO | Source: Ambulatory Visit | Attending: Physician Assistant | Admitting: Physician Assistant

## 2018-06-04 DIAGNOSIS — R112 Nausea with vomiting, unspecified: Secondary | ICD-10-CM | POA: Insufficient documentation

## 2018-06-04 DIAGNOSIS — K30 Functional dyspepsia: Secondary | ICD-10-CM | POA: Insufficient documentation

## 2018-06-04 MED ORDER — SODIUM CHLORIDE 0.9 % IV MBP
INTRAVENOUS | Status: AC
Start: 2018-06-04 — End: 2018-06-04
  Filled 2018-06-04: qty 50

## 2018-06-04 MED ORDER — SINCALIDE 5 MCG IJ SOLR
0.02 ug/kg | Freq: Once | INTRAMUSCULAR | Status: AC | PRN
Start: 2018-06-04 — End: 2018-06-04

## 2018-06-04 MED ORDER — TECHNETIUM TC 99M MEBROFENIN
6.20 | Freq: Once | Status: AC | PRN
Start: 2018-06-04 — End: 2018-06-04
  Administered 2018-06-04: 10:00:00 6 via INTRAVENOUS

## 2018-09-26 IMAGING — CT CT ABD-PELV W/ CM
2 of 4 series · 16 of 46 positions shown, 18 images · IV contrast (APPLIED)
Comparison: None.

CLINICAL DATA: Left upper quadrant pain with nausea and vomiting
for 2 days. History of asthma, gastroesophageal reflux disease, and
inguinal hernia repair.

EXAM:
CT ABDOMEN AND PELVIS WITH CONTRAST
TECHNIQUE: Multidetector CT imaging of the abdomen and pelvis was performed
using the standard protocol following bolus administration of
intravenous contrast.
CONTRAST:  100mL [id number] IOPAMIDOL ([id number]) INJECTION 61%

[Series 2: axial st · axial · 0.74mm/px · z∈[-647,-147]mm · 13 of 110 slices shown, 15 images]
[im 5/110  soft-tissue]
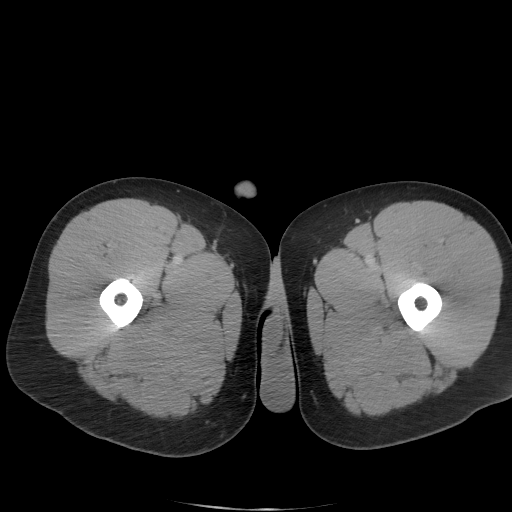
[im 5/110  bone]
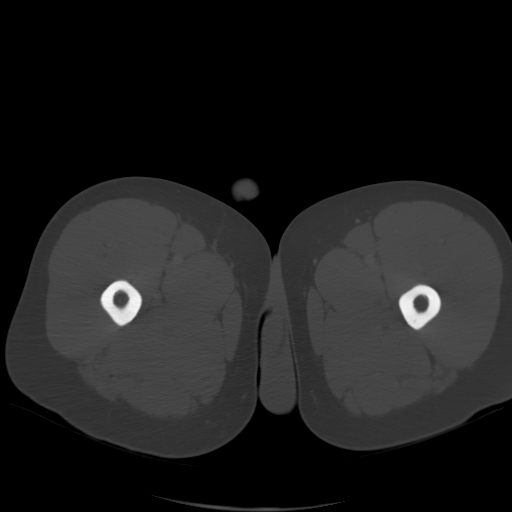
[im 14/110  soft-tissue]
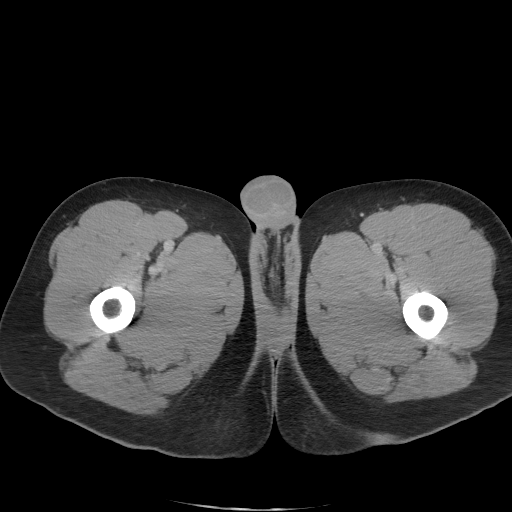
[im 22/110  soft-tissue]
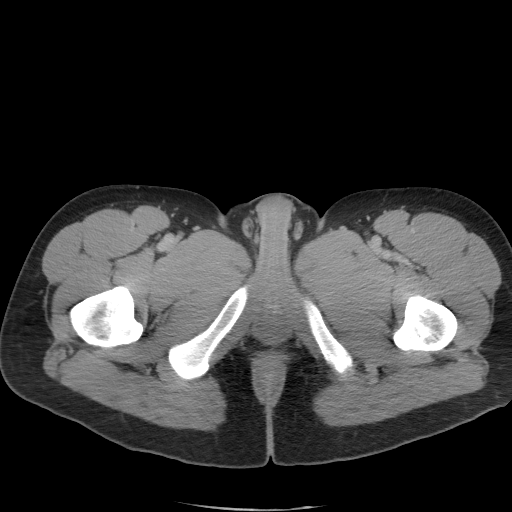
[im 31/110  soft-tissue]
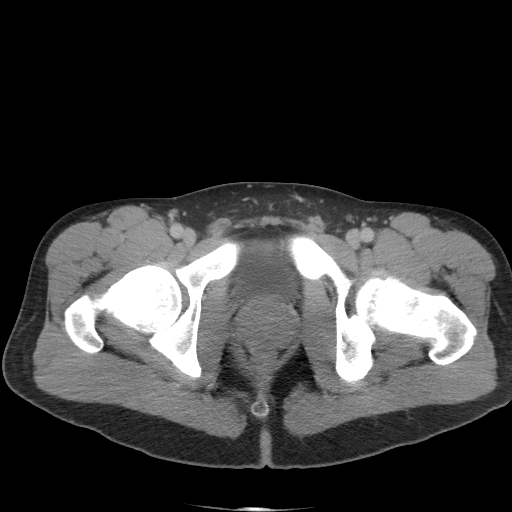
[im 40/110  soft-tissue]
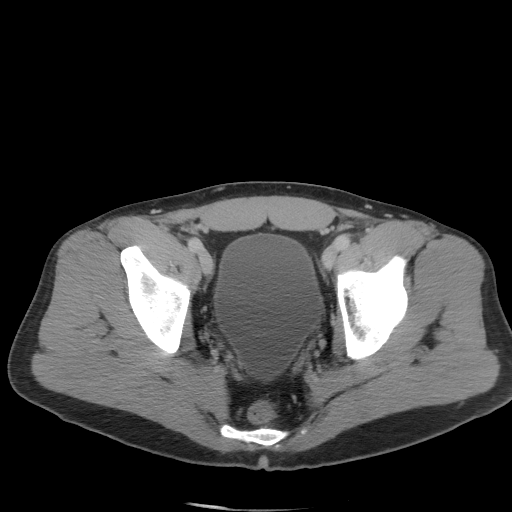
[im 48/110  soft-tissue]
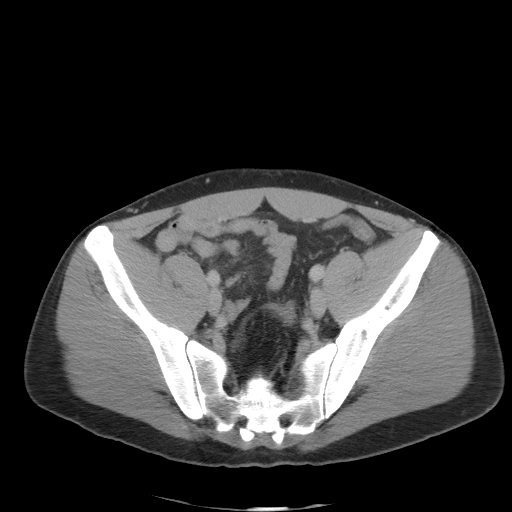
[im 57/110  soft-tissue]
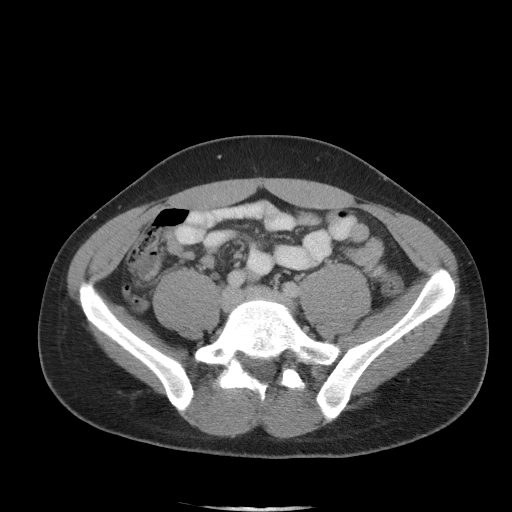
[im 62/110  soft-tissue]
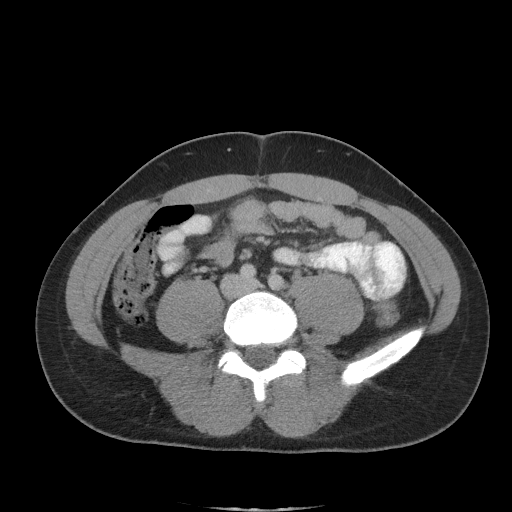
[im 70/110  soft-tissue]
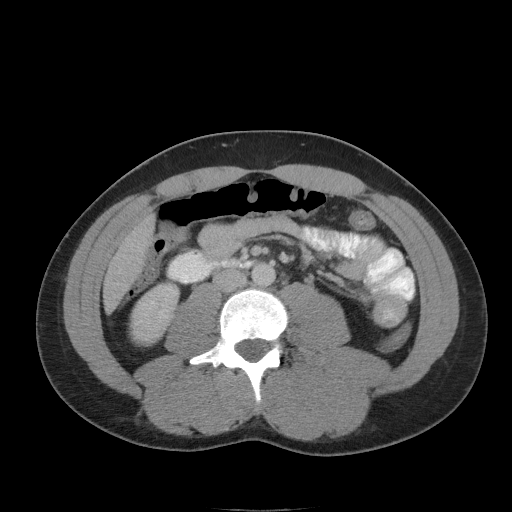
[im 70/110  bone]
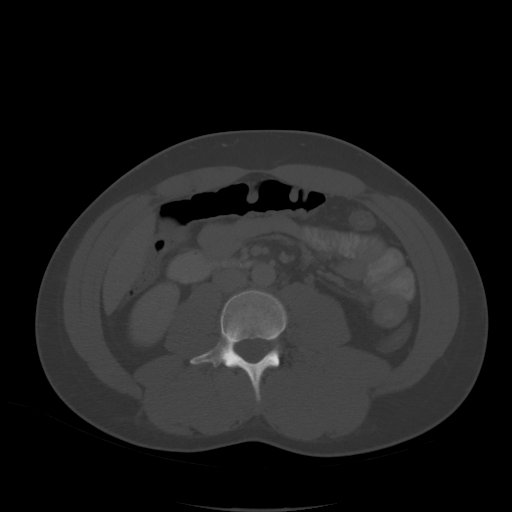
[im 79/110  soft-tissue]
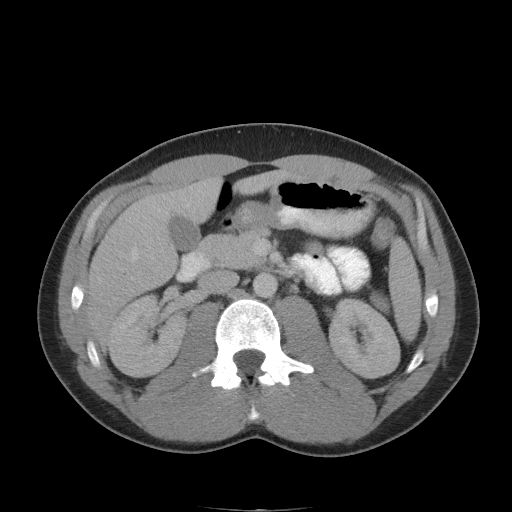
[im 88/110  soft-tissue]
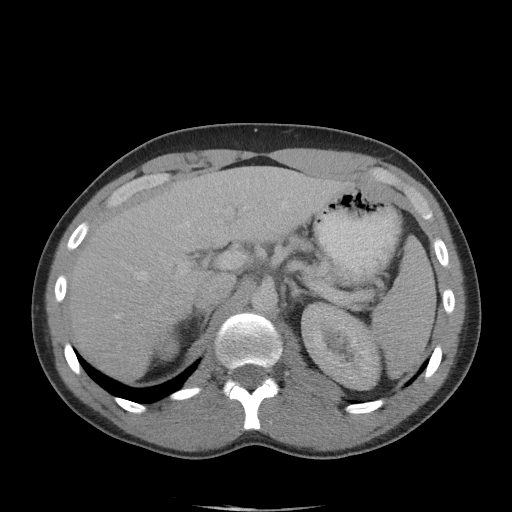
[im 96/110  soft-tissue]
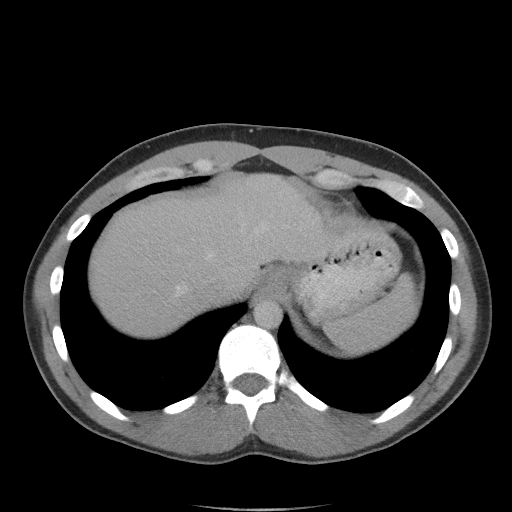
[im 105/110  soft-tissue]
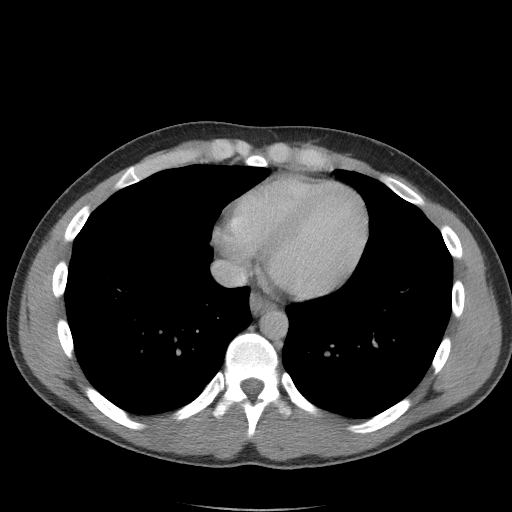

[Series 5: coronal st · coronal · 0.87mm/px · 3 of 79 slices shown]
[im 27/79  soft-tissue]
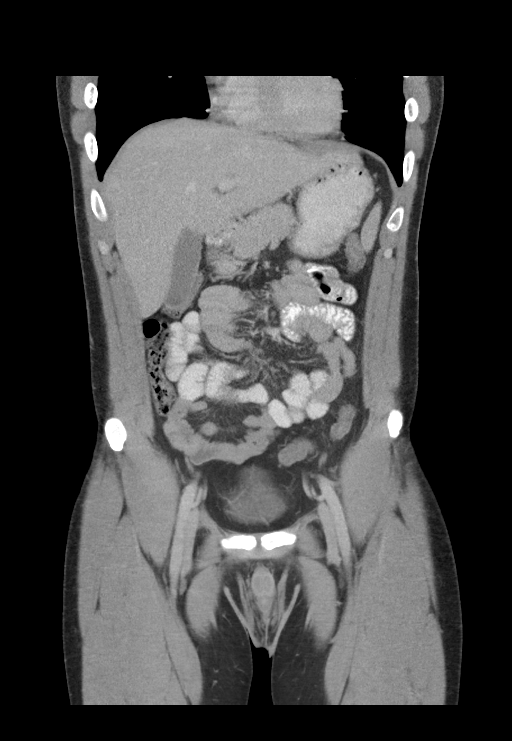
[im 35/79  soft-tissue]
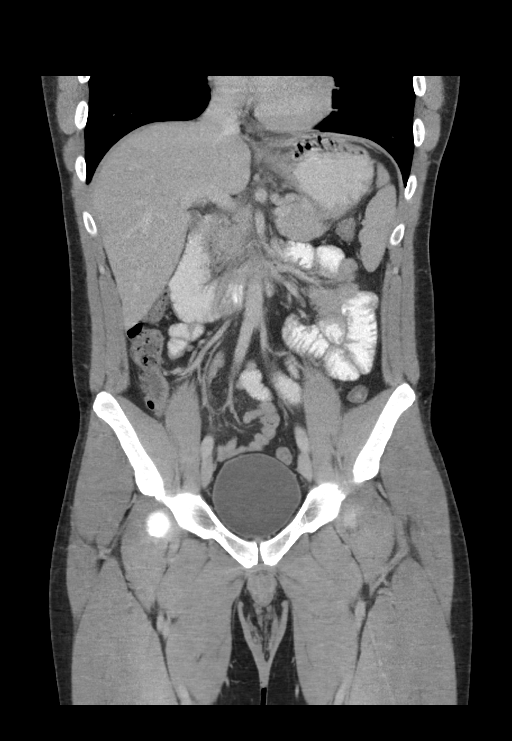
[im 44/79  soft-tissue]
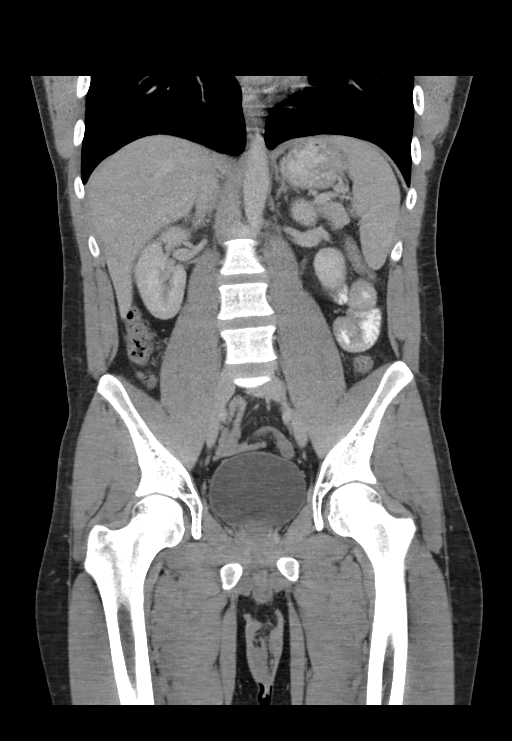

[16 of 46 positions shown; findings below may reference images not displayed]

FINDINGS: Lower chest: No acute abnormality.

Hepatobiliary: No focal liver abnormality is seen. No gallstones,
gallbladder wall thickening, or biliary dilatation.

Pancreas: Unremarkable. No pancreatic ductal dilatation or
surrounding inflammatory changes.

Spleen: Normal in size without focal abnormality.

Adrenals/Urinary Tract: Adrenal glands are unremarkable. Kidneys are
normal, without renal calculi, focal lesion, or hydronephrosis.
Bladder is unremarkable.

Stomach/Bowel: Stomach is within normal limits. Appendix appears
normal. No evidence of bowel wall thickening, distention, or
inflammatory changes.

Vascular/Lymphatic: No significant vascular findings are present. No
enlarged abdominal or pelvic lymph nodes.

Reproductive: Prostate gland is mildly enlarged for age, measuring
about 4.3 cm diameter.

Other: No abdominal wall hernia or abnormality. No abdominopelvic
ascites.

Musculoskeletal: No acute or significant osseous findings.
IMPRESSION: No acute process demonstrated in the abdomen or pelvis. No evidence
of bowel obstruction or inflammation. Mild prostate gland
enlargement of nonspecific etiology.

## 2019-04-30 IMAGING — US US ABDOMEN LIMITED
1 series · 14 of 25 positions shown · non-contrast
Comparison: None.

CLINICAL DATA: Nausea, vomiting, epigastric pain, elevated
bilirubin

EXAM:
ULTRASOUND ABDOMEN LIMITED RIGHT UPPER QUADRANT

[Series 1: us abdomen limited · 0.18mm/px · 14 of 57 slices shown]
[im 1/57]
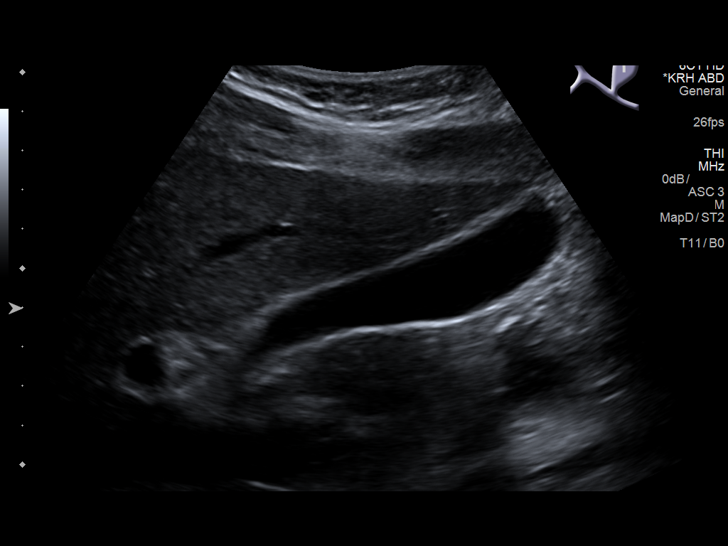
[im 5/57]
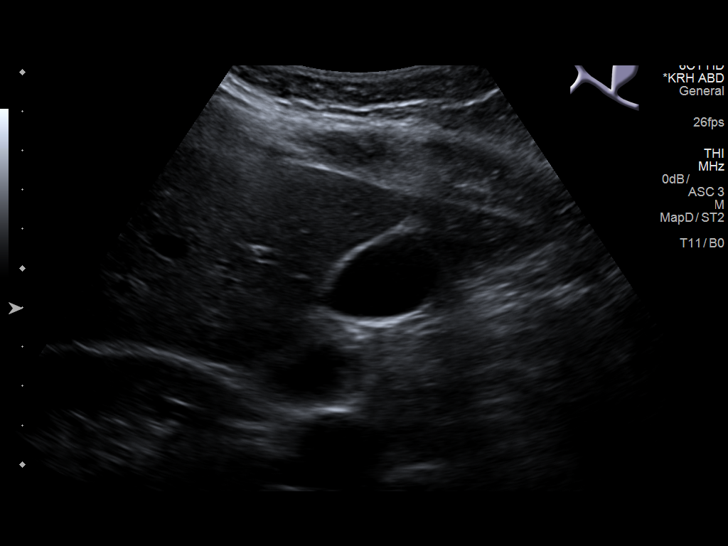
[im 10/57]
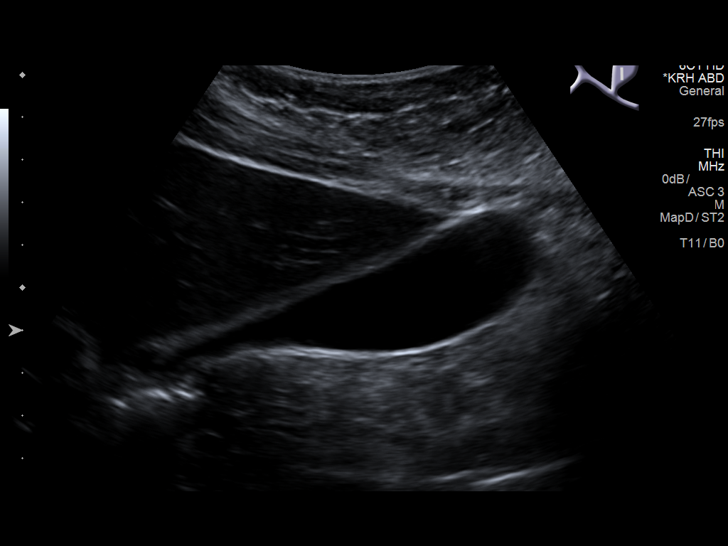
[im 15/57]
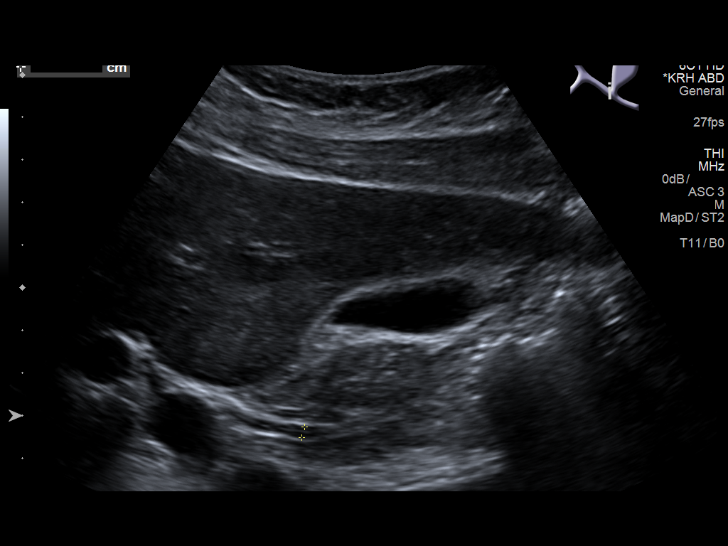
[im 19/57]
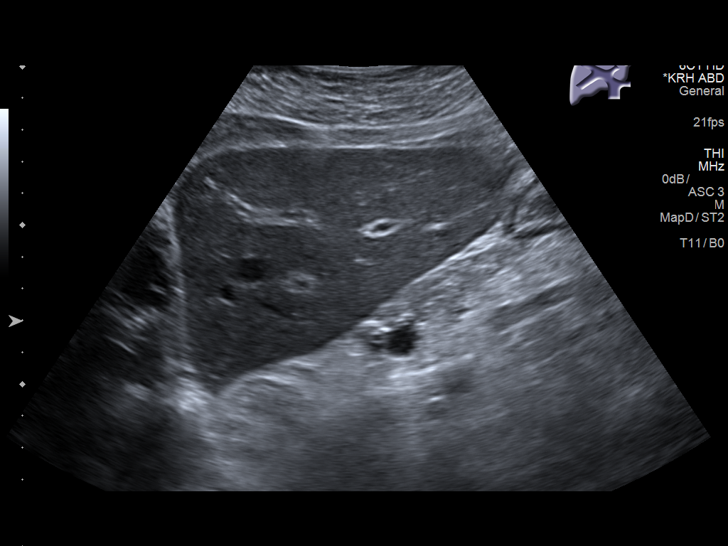
[im 22/57]
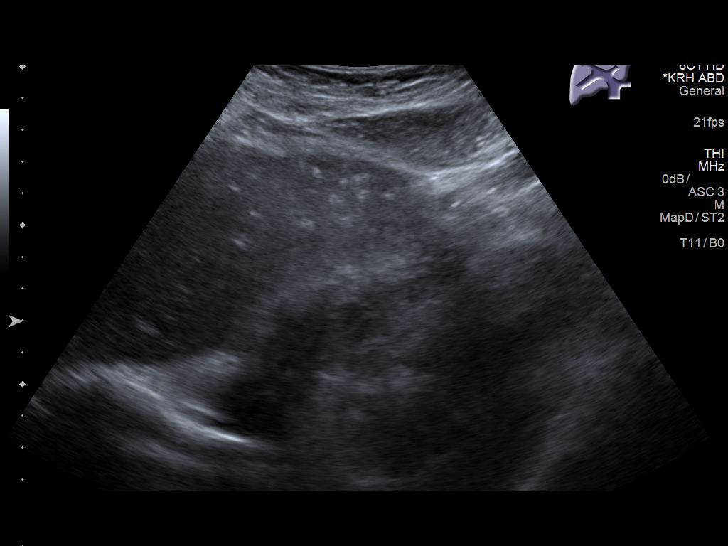
[im 26/57]
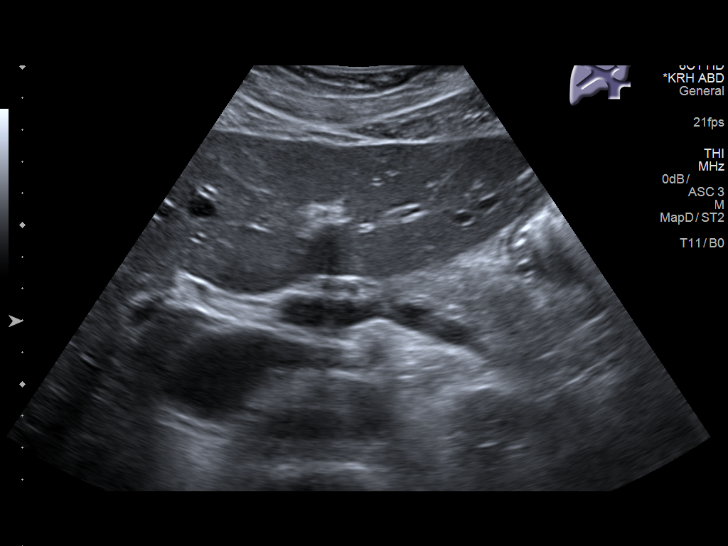
[im 31/57]
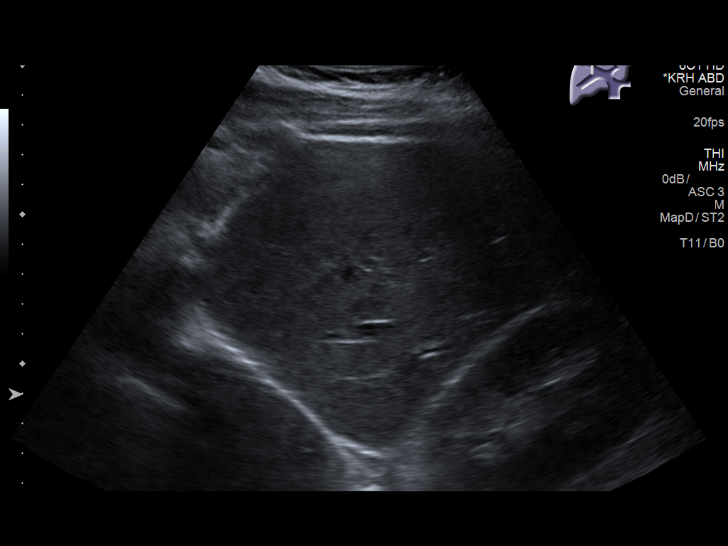
[im 36/57]
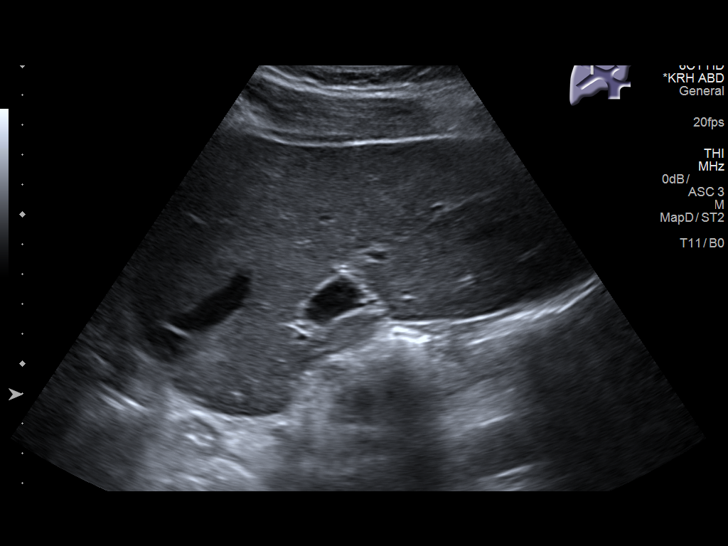
[im 38/57]
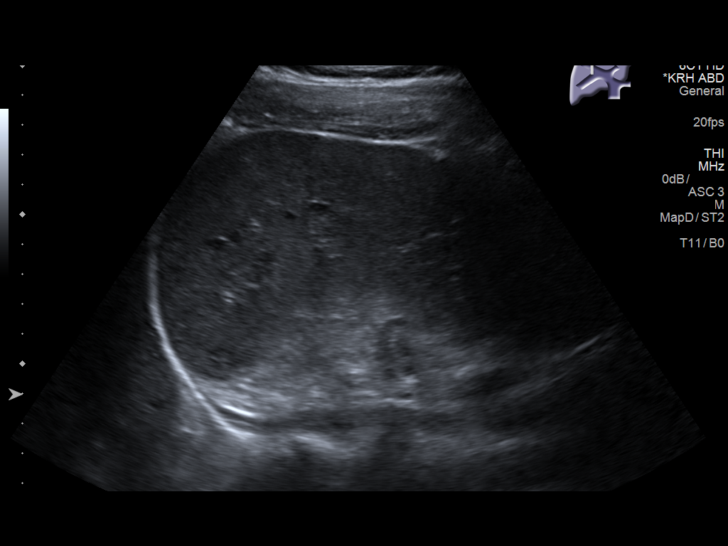
[im 43/57]
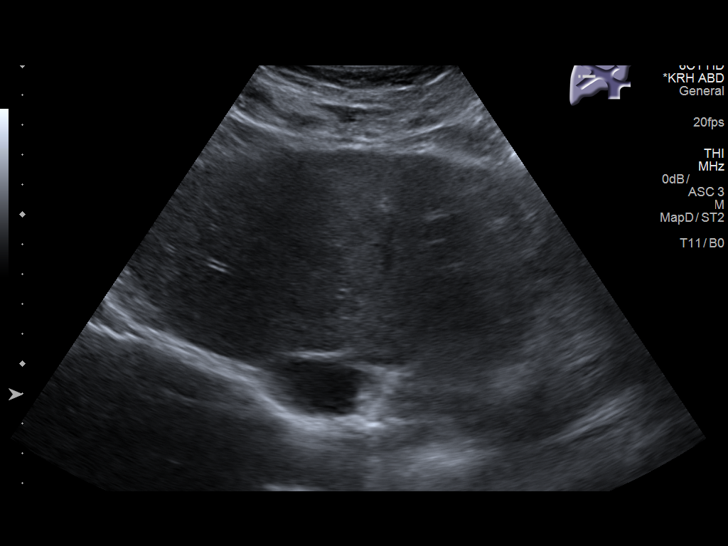
[im 47/57]
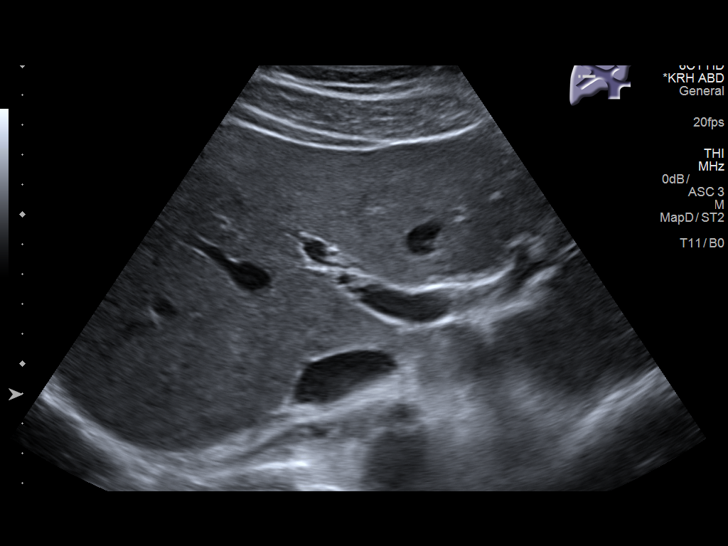
[im 52/57]
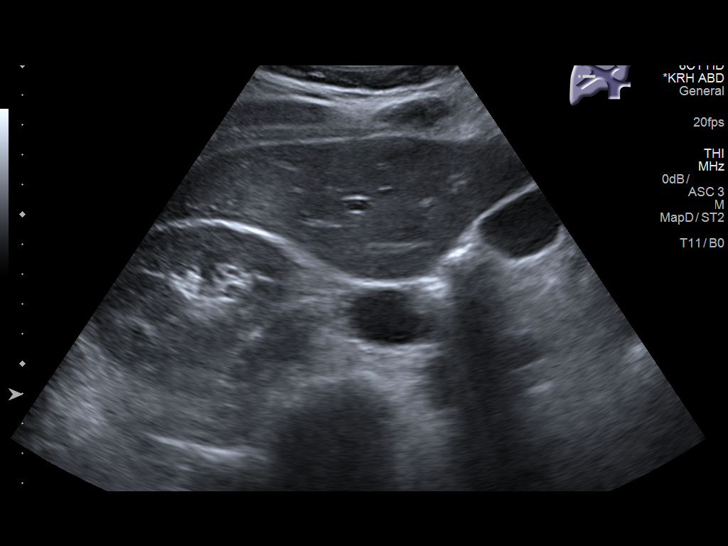
[im 57/57]
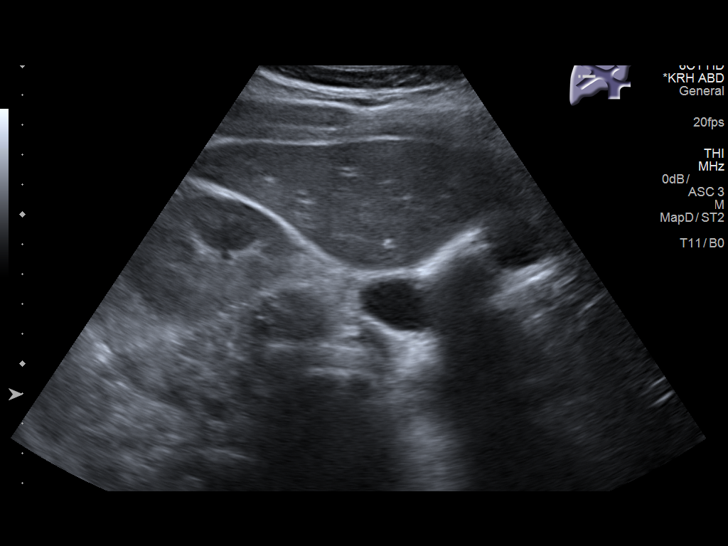

[14 of 25 positions shown; findings below may reference images not displayed]

FINDINGS: Gallbladder:

No gallstones or wall thickening visualized. No sonographic Murphy
sign noted by sonographer.

Common bile duct:

Diameter: 2.6 mm diameter, unremarkable

Liver:

No focal lesion identified. Within normal limits in parenchymal
echogenicity.
IMPRESSION: Negative.  Normal gallbladder

## 2023-09-29 ENCOUNTER — Emergency Department: Admission: EM | Admit: 2023-09-29 | Discharge: 2023-09-29 | Disposition: A | Discharge status: Home / Self Care

## 2023-09-29 ENCOUNTER — Emergency Department

## 2023-09-29 ENCOUNTER — Encounter (INDEPENDENT_AMBULATORY_CARE_PROVIDER_SITE_OTHER): Payer: Self-pay | Admitting: Family

## 2023-09-29 ENCOUNTER — Ambulatory Visit (INDEPENDENT_AMBULATORY_CARE_PROVIDER_SITE_OTHER): Admitting: Family

## 2023-09-29 VITALS — BP 127/80 | HR 61 | Temp 98.1°F | Resp 18 | Wt 216.0 lb

## 2023-09-29 DIAGNOSIS — N5089 Other specified disorders of the male genital organs: Secondary | ICD-10-CM | POA: Insufficient documentation

## 2023-09-29 LAB — URINALYSIS WITH REFLEX TO MICROSCOPIC EXAM - REFLEX TO CULTURE
Urine Bilirubin: NEGATIVE
Urine Blood: NEGATIVE
Urine Glucose: NEGATIVE
Urine Ketones: NEGATIVE mg/dL
Urine Leukocyte Esterase: NEGATIVE
Urine Nitrite: NEGATIVE
Urine Protein: NEGATIVE
Urine Specific Gravity: 1.024 (ref 1.001–1.035)
Urine Urobilinogen: NORMAL mg/dL (ref 0.2–2.0)
Urine pH: 7.5 (ref 5.0–8.0)

## 2023-09-29 LAB — LAB USE ONLY - URINE GRAY CULTURE HOLD TUBE

## 2023-09-29 NOTE — Progress Notes (Signed)
 [facility]  [facility]       Subjective     Patient      Chief Complaint   Patient presents with    Groin Swelling     Testicle swelling, with out any pain        History of Present Illness  C[facility] Sanchez is a 33 year old male who presents with swelling around the testicles and base of the penis. Swelling appeared late morning to early afternoon yesterday, primarily on one side, described as a bulge larger than previously observed. There is no associated pain, discomfort, inflammation, itchiness, redness, or discoloration. No urinary symptoms are present. The swelling has decreased today compared to yesterday.  He has a history of inguinal hernia repair at age [age] and a known varicocele. He works as a Nature conservation officer at [employer], involving constant physical activity and being on his feet daily, which he is concerned might be related to his symptoms.     History:  Medications and Allergies reviewed.   Pertinent Past Medical, Surgical, Family and Social History were reviewed.        Objective     Vitals:    09/29/23 0824   BP: 127/80   BP Site: Left arm   Patient Position: Sitting   Cuff Size: Large   Pulse: 61   Resp: 18   Temp: 98.1 F (36.7 C)   TempSrc: Tympanic   SpO2: 98%   Weight: 98 kg (216 lb)       Physical Exam  Physical Exam  Constitutional:       General: He is not in acute distress.     Appearance: Normal appearance. He is not ill-appearing, toxic-appearing or diaphoretic.   HENT:      Head: Normocephalic and atraumatic.      Right Ear: Tympanic membrane, ear canal and external ear normal. There is no impacted cerumen.      Left Ear: Tympanic membrane, ear canal and external ear normal. There is no impacted cerumen.      Nose: Nose normal. No congestion or rhinorrhea.      Mouth/Throat:      Mouth: Mucous membranes are moist.      Pharynx: Oropharynx is clear. No oropharyngeal exudate or posterior oropharyngeal erythema.     Cardiovascular:      Rate and Rhythm: Normal rate and  regular rhythm.      Pulses: Normal pulses.      Heart sounds: Normal heart sounds.   Pulmonary:      Effort: Pulmonary effort is normal. No respiratory distress.      Breath sounds: Normal breath sounds. No stridor. No wheezing, rhonchi or rales.     Neurological:      General: No focal deficit present.      Mental Status: He is alert and oriented to person, place, and time.     Skin:     General: Skin is warm and dry.   Chest:      Chest wall: No tenderness.   Vitals and nursing note reviewed.     GENITOURINARY: Epididymis normal. Left testis slightly enlarged compared to right. No inguinal hernia.      Urgent Care Course     There were no labs reviewed with this patient during the visit.    There were no x-rays reviewed with this patient during the visit.               Procedures  Procedures         Assessment/Plan     Differential Diagnoses including but not limited to: orchitis, Hydrocele, testicular torsion, epididymitis    Assessment & Plan  Scrotal Swelling  Acute swelling around testicles and base of penis, more pronounced on one side. Differential includes hernia, varicocele, or other scrotal pathology. History of inguinal hernia repair and varicocele. Left testis slightly enlarged. Ultrasound needed to rule out emergent issues.  - Order scrotal ultrasound.  - Referral given for further evaluation and potential  imaging test at [facility].  Varicocele  Varicocele may contribute to scrotal swelling. No tenderness on examination.         There are no diagnoses linked to this encounter.       The indications for early follow-up with PCP and return to UC were discussed. Patient/family received education on the working diagnosis, diagnostic uncertainties and proposed treatment plan. The possibility of illness progression was discussed and strict ED precautions for serious pathologies were given to patient/family. Written and verbal discharge instructions were provided and discussed, with no apparent  barriers. All questions from the patient and family were answered to their satisfaction, and they confirmed understanding of the instructions with no apparent learning barriers.     The patient has provided consent for being recorded during today's visit.

## 2023-09-29 NOTE — Discharge Instructions (Addendum)
 If you do not continue to improve or your condition worsens, you have rash/redness, worsening swelling of the testicle, testicular pain, abdominal pain, vomiting, fever, please contact your doctor or return immediately to the Emergency Department. Illness and symptoms can evolve over time. It is important to remain vigilant while monitoring the disease process.     DOCTOR REFERRALS  The examination and treatment you have received in our Emergency Department is provided on an emergency basis, and is not intended to be a substitute for your primary care physician.  It is important that your doctor checks you again and that you report any new or remaining problems at that time. Call [phone] if you need any further referrals and we can help you find a primary care doctor or specialist.  Also, available online at:  [email or web address]      FREE HEALTH SERVICES  If you need help with health or social services, please call 2-1-1 for a free referral to resources in your area.  2-1-1 is a free service connecting people with information on health insurance, free clinics, pregnancy, mental health, dental care, food assistance, housing, and substance abuse counseling.  Also, available online at:  http://www.[facility].org

## 2023-09-29 NOTE — ED Notes (Signed)
 This RN chaperone for [employer] while pt was being examined in room

## 2023-09-29 NOTE — ED Provider Notes (Signed)
 EMERGENCY DEPARTMENT NOTE      Date: 09/29/2023  Patient Name: [facility]  Attending Physician: Daril Juliene FALCON, DO  Advanced Practice Provider: Jannice Blanch, PA-C    History of Presenting Illness     Chief Complaint   Patient presents with    Groin Swelling       HPI: Caleb Sanchez is a pleasant 33 y.o. male presenting to the emergency department with bilateral testicular swelling ongoing since yesterday.  Denies any trauma to the area, does note he has been going to the gym more recently.  Reports it started in the afternoon time yesterday, this morning it seems to have slightly improved.  Denies any noticed increased redness to the area, tenderness of the testicles, penile pain, genital rash, dysuria, hematuria, CP, abdominal pain, penile discharge, or other areas of swelling.  Reports he is sexually active with a single male partner, his wife, and does not use barrier methods.  Reports he is not concerned for STIs.  Notes history of inguinal hernia repair at the age of 6 as well as known past medical history of varicocele. Pt referred by UC for testicular US .     History Provided By: pt  Interpreter: none    PCP: Pcp, None, MD    Physical Exam     Vitals:    09/29/23 0918 09/29/23 1120   BP: (!) 150/91 118/65   Pulse: 75 61   Resp: 18 16   Temp: 98.1 F (36.7 C) 98.3 F (36.8 C)   TempSrc: Oral    SpO2: 97% 97%     General assessment: Pt is awake. Well-nourished, well-developed. Not toxic-appearing or diaphoretic. NAD.  Psychiatric: Normal affect, insight and concentration for age. Behavior is cooperative.   Cardiovascular: Normal rate.   Pulmonary: Pulmonary effort is normal. No respiratory distress.   Abdominal: Abdomen is soft. There is no abdominal tenderness. No guarding, rigidity, or rebound tenderness.   HEENT: Normocephalic, atraumatic.   Neurological: Pt is alert. A&O x 3. No obvious focal neuro deficits.   GU: Exam conducted with a chaperone present. Penis without rash, lesions, swelling, or  ecchymosis. No high riding testicle. Bilateral testicular swelling without erythema. No TTP of epididymis or testes bilaterally. No palpable mass of bilateral testes. No perineal erythema or crepitus. No evidence of strangulated or incarcerated hernia bilaterally.     Past History     Listed Home Medications:  Home Medications           Flagged for Removal               omeprazole (PRILOSEC) 40 MG capsule     1 CAPSULE ONCE A DAY 30 MINUTES BEFORE BREAKFAST ORALLY 30 DAY(S)     Patient not taking: Reported on 09/29/2023          Allergies:  Allergies[1]  Past Medical History:  Past Medical History:   Diagnosis Date    Gastritis     Gastroesophageal reflux disease      Past Surgical History:  Past Surgical History[2]  Family History:  Family History[3]  Social History:  Social History[4]  I reviewed VS and available nursing notes, medical hx, surgical hx, family hx, and social hx.    Diagnostics     Results for orders placed or performed during the hospital encounter of 09/29/23 (from the past 24 hours)   Urinalysis with Reflex to Microscopic Exam and Culture    Collection Time: 09/29/23 10:08 AM    Specimen: Urine, Clean  Catch   Result Value    Urine Color Straw    Urine Clarity Hazy    Urine Specific Gravity 1.024    Urine pH 7.5    Urine Leukocyte Esterase Negative    Urine Nitrite Negative    Urine Protein Negative    Urine Glucose Negative    Urine Ketones Negative    Urine Urobilinogen Normal    Urine Bilirubin Negative    Urine Blood Negative     I have reviewed the lab findings above.    Radiologic Studies:   US  Scrotum/Testicleswith Duplx Dopp  Result Date: 09/29/2023   No findings to account for reported symptoms, nor other acute abnormality detected. Kiet Nguyen, MD 09/29/2023 11:08 AM  .    ED Course         Pt provided with the following in the ED for sx relief:   ED Medication Orders (From admission, onward)      None            Medical Decision Making     Patient is a 33 y.o. male presenting to the ED  with bilateral testicular swelling since yesterday that seems to have slightly improved today.  Patient in no acute distress with stable vital signs, declining pain medications in ED.    Differential includes but is not limited to testicular torsion, epididymitis, orchitis,  UTI, STI, varicocele, hydrocele, abscess, prostatitis, distal nephrolithiasis      Ordered UA, urine G/C, testicular US . Exam not consistent balanitis/prostatitis and no genital rash on exam. No evidence of cellulitis, abscess, or fournier's gangrene on exam - no noted erythema or crepitus. Low suspicion for CHF given age and no leg swelling/SOB/cardiac history. UA showed no UTI. Testicular US  revealed no evidence of testicular abscess, testicular mass, or other abnormality. Patient informed that STI testing takes a few days to result. Discussed risk/benefits of empiric treatment for STIs and patient declined empiric tx at this time, discussed staff will call with positive results and he is able to access results via mychart. Discussed plan for scrotal support. Advised f/u with PCP/urology in upcoming week, with return to nearest ED if any new or worsening sx and discussed red flags.    ED Course as of 09/29/23 1155   Sat Sep 29, 2023   1147 IMPRESSION:      No findings to account for reported symptoms, nor other acute abnormality  detected.     Kiet Nguyen, MD  09/29/2023 11:08 AM   [TP]      ED Course User Index  [TP] Tobie Fields, PA     Though other pathology possible, pt is without current signs of instability, medical urgency or emergency.  Pt well appearing at discharge.  Pt/caregiver understands possibility of progression of disease, need for continued care and assessment outpatient.  Aftercare and return precautions discussed.    Diagnosis     Clinical Impression:   1. Testicular swelling        Medications Prescribed:   Discharge Prescriptions    None         Disposition:   ED Disposition       ED Disposition   Discharge    Condition    --    Date/Time   Sat Sep 29, 2023 11:51 AM    Comment   Gaynor Neddo discharge to home/self care.    Condition at disposition: Stable  This note was completed using dragon medical speech recognition software. Grammatical errors, random word insertions, pronoun errors, incorrect word insertion, misspellings and incomplete sentences are occasional consequences of this technology due to software limitations. If there are questions or concerns about the content of this note or information contained within the body of this dictation they should be addressed with the provider for clarification. This chart may have been refreshed after admission or discharge, and therefore some results may not have been available for my review at the time of my involvement with the patient.  _____________________________    CHART OWNERSHIP: I, Jannice Blanch, PA, am the primary clinician of record.       [1]   Allergies  Allergen Reactions    Latex     Penicillins    [2]   Past Surgical History:  Procedure Laterality Date    FOOT SURGERY      HERNIA REPAIR      NASAL SEPTUM SURGERY     [3] No family history on file.  [4]   Social History  Socioeconomic History    Marital status: Married   Tobacco Use    Smoking status: Never    Smokeless tobacco: Never   Vaping Use    Vaping status: Never Used   Substance and Sexual Activity    Alcohol use: No     Social Drivers of Engineer, water Insecurity: No Food Insecurity (09/29/2023)    Hunger Vital Sign     Worried About Running Out of Food in the Last Year: Never true     Ran Out of Food in the Last Year: Never true   Transportation Needs: No Transportation Needs (09/29/2023)    PRAPARE - Therapist, art (Medical): No     Lack of Transportation (Non-Medical): No   Intimate Partner Violence: Not At Risk (09/29/2023)    Humiliation, Afraid, Rape, and Kick questionnaire     Fear of Current or Ex-Partner: No     Emotionally Abused: No     Physically Abused:  No     Sexually Abused: No   Housing Stability: Low Risk  (09/29/2023)    Housing Stability Vital Sign     Unable to Pay for Housing in the Last Year: No     Number of Times Moved in the Last Year: 0     Homeless in the Last Year: No        Blanch Jannice, [place]  09/29/23 1156       Daril Juliene FALCON, DO  09/29/23 1220

## 2023-10-01 LAB — CHLAMYDIA TRACHOMATIS, NEISSERIA GONORRHOEAE, MYCOPLASMA GENITALIUM AND TRICHOMONAS VAGINALIS
Chlamydia trachomatis DNA: NEGATIVE
Mycoplasma genitalium DNA: NEGATIVE
Neisseria gonorrhoeae DNA: NEGATIVE
Trichomonas vaginalis DNA: NEGATIVE
